# Patient Record
Sex: Female | Born: 1950
Health system: Southern US, Community
[De-identification: ages and names within clinical notes are randomized; demographics above are authoritative.]

## PROBLEM LIST (undated history)

## (undated) DIAGNOSIS — L309 Dermatitis, unspecified: Secondary | ICD-10-CM

## (undated) DIAGNOSIS — R51 Headache: Secondary | ICD-10-CM

## (undated) DIAGNOSIS — Z780 Asymptomatic menopausal state: Secondary | ICD-10-CM

## (undated) DIAGNOSIS — B977 Papillomavirus as the cause of diseases classified elsewhere: Secondary | ICD-10-CM

## (undated) DIAGNOSIS — D219 Benign neoplasm of connective and other soft tissue, unspecified: Secondary | ICD-10-CM

## (undated) DIAGNOSIS — R0602 Shortness of breath: Secondary | ICD-10-CM

## (undated) DIAGNOSIS — B379 Candidiasis, unspecified: Secondary | ICD-10-CM

## (undated) DIAGNOSIS — F329 Major depressive disorder, single episode, unspecified: Secondary | ICD-10-CM

## (undated) DIAGNOSIS — L719 Rosacea, unspecified: Secondary | ICD-10-CM

## (undated) DIAGNOSIS — Z823 Family history of stroke: Secondary | ICD-10-CM

## (undated) DIAGNOSIS — T8859XA Other complications of anesthesia, initial encounter: Secondary | ICD-10-CM

## (undated) DIAGNOSIS — N159 Renal tubulo-interstitial disease, unspecified: Secondary | ICD-10-CM

## (undated) DIAGNOSIS — Z8619 Personal history of other infectious and parasitic diseases: Secondary | ICD-10-CM

## (undated) DIAGNOSIS — A499 Bacterial infection, unspecified: Secondary | ICD-10-CM

## (undated) DIAGNOSIS — T4145XA Adverse effect of unspecified anesthetic, initial encounter: Secondary | ICD-10-CM

## (undated) DIAGNOSIS — Z809 Family history of malignant neoplasm, unspecified: Secondary | ICD-10-CM

## (undated) DIAGNOSIS — D126 Benign neoplasm of colon, unspecified: Secondary | ICD-10-CM

## (undated) DIAGNOSIS — Z87898 Personal history of other specified conditions: Secondary | ICD-10-CM

## (undated) DIAGNOSIS — N6009 Solitary cyst of unspecified breast: Secondary | ICD-10-CM

## (undated) DIAGNOSIS — Z8249 Family history of ischemic heart disease and other diseases of the circulatory system: Secondary | ICD-10-CM

## (undated) DIAGNOSIS — F419 Anxiety disorder, unspecified: Secondary | ICD-10-CM

## (undated) DIAGNOSIS — Z8719 Personal history of other diseases of the digestive system: Secondary | ICD-10-CM

## (undated) DIAGNOSIS — I6529 Occlusion and stenosis of unspecified carotid artery: Secondary | ICD-10-CM

## (undated) DIAGNOSIS — K219 Gastro-esophageal reflux disease without esophagitis: Secondary | ICD-10-CM

## (undated) DIAGNOSIS — IMO0002 Reserved for concepts with insufficient information to code with codable children: Secondary | ICD-10-CM

## (undated) DIAGNOSIS — Z8669 Personal history of other diseases of the nervous system and sense organs: Secondary | ICD-10-CM

## (undated) DIAGNOSIS — R41840 Attention and concentration deficit: Secondary | ICD-10-CM

## (undated) HISTORY — DX: Asymptomatic menopausal state: Z78.0

## (undated) HISTORY — DX: Family history of ischemic heart disease and other diseases of the circulatory system: Z82.49

## (undated) HISTORY — DX: Personal history of other diseases of the nervous system and sense organs: Z86.69

## (undated) HISTORY — DX: Family history of stroke: Z82.3

## (undated) HISTORY — DX: Rosacea, unspecified: L71.9

## (undated) HISTORY — DX: Personal history of other diseases of the digestive system: Z87.19

## (undated) HISTORY — DX: Benign neoplasm of connective and other soft tissue, unspecified: D21.9

## (undated) HISTORY — DX: Candidiasis, unspecified: B37.9

## (undated) HISTORY — DX: Personal history of other infectious and parasitic diseases: Z86.19

## (undated) HISTORY — DX: Attention and concentration deficit: R41.840

## (undated) HISTORY — DX: Benign neoplasm of colon, unspecified: D12.6

## (undated) HISTORY — DX: Occlusion and stenosis of unspecified carotid artery: I65.29

## (undated) HISTORY — DX: Papillomavirus as the cause of diseases classified elsewhere: B97.7

## (undated) HISTORY — DX: Renal tubulo-interstitial disease, unspecified: N15.9

## (undated) HISTORY — DX: Bacterial infection, unspecified: A49.9

## (undated) HISTORY — DX: Major depressive disorder, single episode, unspecified: F32.9

## (undated) HISTORY — DX: Gastro-esophageal reflux disease without esophagitis: K21.9

## (undated) HISTORY — DX: Reserved for concepts with insufficient information to code with codable children: IMO0002

## (undated) HISTORY — DX: Family history of malignant neoplasm, unspecified: Z80.9

## (undated) HISTORY — DX: Anxiety disorder, unspecified: F41.9

## (undated) HISTORY — DX: Dermatitis, unspecified: L30.9

## (undated) HISTORY — DX: Solitary cyst of unspecified breast: N60.09

## (undated) HISTORY — PX: EYE SURGERY: SHX253

## (undated) HISTORY — DX: Personal history of other specified conditions: Z87.898

---

## 1984-04-04 DIAGNOSIS — IMO0002 Reserved for concepts with insufficient information to code with codable children: Secondary | ICD-10-CM

## 1984-04-04 DIAGNOSIS — R87619 Unspecified abnormal cytological findings in specimens from cervix uteri: Secondary | ICD-10-CM

## 1984-04-04 HISTORY — PX: MYOMECTOMY: SHX85

## 1984-04-04 HISTORY — DX: Reserved for concepts with insufficient information to code with codable children: IMO0002

## 1984-04-04 HISTORY — DX: Unspecified abnormal cytological findings in specimens from cervix uteri: R87.619

## 1994-04-04 HISTORY — PX: HYSTEROSCOPY: SHX211

## 1999-12-10 ENCOUNTER — Encounter: Admission: RE | Admit: 1999-12-10 | Discharge: 1999-12-10 | Payer: Self-pay | Admitting: *Deleted

## 1999-12-10 ENCOUNTER — Encounter: Payer: Self-pay | Admitting: *Deleted

## 2000-01-17 ENCOUNTER — Other Ambulatory Visit: Admission: RE | Admit: 2000-01-17 | Discharge: 2000-01-17 | Payer: Self-pay | Admitting: Obstetrics and Gynecology

## 2000-10-13 ENCOUNTER — Encounter: Payer: Self-pay | Admitting: *Deleted

## 2000-10-13 ENCOUNTER — Encounter: Admission: RE | Admit: 2000-10-13 | Discharge: 2000-10-13 | Payer: Self-pay | Admitting: *Deleted

## 2001-01-29 ENCOUNTER — Other Ambulatory Visit: Admission: RE | Admit: 2001-01-29 | Discharge: 2001-01-29 | Payer: Self-pay | Admitting: Obstetrics and Gynecology

## 2001-03-09 ENCOUNTER — Ambulatory Visit (HOSPITAL_COMMUNITY): Admission: RE | Admit: 2001-03-09 | Discharge: 2001-03-09 | Payer: Self-pay | Admitting: Gastroenterology

## 2002-03-12 ENCOUNTER — Other Ambulatory Visit: Admission: RE | Admit: 2002-03-12 | Discharge: 2002-03-12 | Payer: Self-pay | Admitting: Obstetrics and Gynecology

## 2003-03-20 ENCOUNTER — Other Ambulatory Visit: Admission: RE | Admit: 2003-03-20 | Discharge: 2003-03-20 | Payer: Self-pay | Admitting: Obstetrics and Gynecology

## 2003-04-16 ENCOUNTER — Ambulatory Visit (HOSPITAL_COMMUNITY): Admission: RE | Admit: 2003-04-16 | Discharge: 2003-04-16 | Payer: Self-pay | Admitting: Internal Medicine

## 2003-05-06 DIAGNOSIS — D219 Benign neoplasm of connective and other soft tissue, unspecified: Secondary | ICD-10-CM

## 2003-05-06 HISTORY — DX: Benign neoplasm of connective and other soft tissue, unspecified: D21.9

## 2003-05-27 ENCOUNTER — Ambulatory Visit (HOSPITAL_BASED_OUTPATIENT_CLINIC_OR_DEPARTMENT_OTHER): Admission: RE | Admit: 2003-05-27 | Discharge: 2003-05-27 | Payer: Self-pay | Admitting: Obstetrics and Gynecology

## 2003-05-27 ENCOUNTER — Ambulatory Visit (HOSPITAL_COMMUNITY): Admission: RE | Admit: 2003-05-27 | Discharge: 2003-05-27 | Payer: Self-pay | Admitting: Obstetrics and Gynecology

## 2003-05-27 ENCOUNTER — Encounter (INDEPENDENT_AMBULATORY_CARE_PROVIDER_SITE_OTHER): Payer: Self-pay | Admitting: *Deleted

## 2003-12-02 ENCOUNTER — Encounter: Admission: RE | Admit: 2003-12-02 | Discharge: 2003-12-02 | Payer: Self-pay | Admitting: Obstetrics and Gynecology

## 2004-03-23 ENCOUNTER — Other Ambulatory Visit: Admission: RE | Admit: 2004-03-23 | Discharge: 2004-03-23 | Payer: Self-pay | Admitting: Obstetrics and Gynecology

## 2005-06-07 ENCOUNTER — Encounter: Admission: RE | Admit: 2005-06-07 | Discharge: 2005-06-07 | Payer: Self-pay | Admitting: Obstetrics and Gynecology

## 2005-12-16 ENCOUNTER — Other Ambulatory Visit: Admission: RE | Admit: 2005-12-16 | Discharge: 2005-12-16 | Payer: Self-pay | Admitting: Obstetrics & Gynecology

## 2006-07-05 ENCOUNTER — Encounter: Admission: RE | Admit: 2006-07-05 | Discharge: 2006-07-05 | Payer: Self-pay | Admitting: Obstetrics and Gynecology

## 2006-07-11 ENCOUNTER — Encounter: Admission: RE | Admit: 2006-07-11 | Discharge: 2006-07-11 | Payer: Self-pay | Admitting: Allergy and Immunology

## 2007-07-17 ENCOUNTER — Other Ambulatory Visit: Admission: RE | Admit: 2007-07-17 | Discharge: 2007-07-17 | Payer: Self-pay | Admitting: Obstetrics and Gynecology

## 2007-12-06 ENCOUNTER — Encounter: Admission: RE | Admit: 2007-12-06 | Discharge: 2007-12-06 | Payer: Self-pay | Admitting: Obstetrics and Gynecology

## 2008-07-18 ENCOUNTER — Other Ambulatory Visit: Admission: RE | Admit: 2008-07-18 | Discharge: 2008-07-18 | Payer: Self-pay | Admitting: Obstetrics and Gynecology

## 2009-04-15 ENCOUNTER — Other Ambulatory Visit: Admission: RE | Admit: 2009-04-15 | Discharge: 2009-04-15 | Payer: Self-pay | Admitting: Obstetrics and Gynecology

## 2009-04-20 ENCOUNTER — Encounter: Admission: RE | Admit: 2009-04-20 | Discharge: 2009-04-20 | Payer: Self-pay | Admitting: Obstetrics and Gynecology

## 2009-05-08 ENCOUNTER — Encounter: Admission: RE | Admit: 2009-05-08 | Discharge: 2009-05-08 | Payer: Self-pay | Admitting: Family Medicine

## 2009-05-08 DIAGNOSIS — I6529 Occlusion and stenosis of unspecified carotid artery: Secondary | ICD-10-CM | POA: Insufficient documentation

## 2009-05-08 HISTORY — DX: Occlusion and stenosis of unspecified carotid artery: I65.29

## 2010-04-22 ENCOUNTER — Other Ambulatory Visit
Admission: RE | Admit: 2010-04-22 | Discharge: 2010-04-22 | Payer: Self-pay | Source: Home / Self Care | Admitting: Obstetrics and Gynecology

## 2010-04-22 ENCOUNTER — Other Ambulatory Visit: Payer: Self-pay | Admitting: Nurse Practitioner

## 2010-04-25 ENCOUNTER — Encounter: Payer: Self-pay | Admitting: Family Medicine

## 2010-08-20 NOTE — Op Note (Signed)
NAME:  [name], [name]                              ACCOUNT NO.:  [id number]   MEDICAL RECORD NO.:  [id number]                   PATIENT TYPE:  AMB   LOCATION:  [facility]                                 FACILITY:  [facility]   PHYSICIAN:  Laqueta Linden, M.D.                 DATE OF BIRTH:  13-Nov-1950   DATE OF PROCEDURE:  05/27/2003  DATE OF DISCHARGE:                                 OPERATIVE REPORT   PREOPERATIVE DIAGNOSES:  Prolapsing fibroid versus polyp with menorrhagia  and breakthrough bleeding.   POSTOPERATIVE DIAGNOSES:  Prolapsing fibroid versus polyp with menorrhagia  and breakthrough bleeding.   PROCEDURE:  Hysteroscopic evaluation with resection of the polyp and  curettage.   SURGEON:  Laqueta Linden, M.D.   ANESTHESIA:  General LMA.   ESTIMATED BLOOD LOSS:  Less than 20 mL.   SORBITOL NET INTAKE:  25 mL.   COMPLICATIONS:  None.   INDICATIONS FOR PROCEDURE:  [name] [name] is a 60 year old, para 0 female on  progesterone only pills who presents with heavier menses and with clots and  mid cycle breakthrough bleeding on her pills.  She was noted on exam to have  an approximately 1 cm polyp versus fibroid prolapsing through the cervical  os which appeared to be dilating the os. The patient was intolerant of  traction or attempted removal in the office. She is therefore to undergo  hysteroscopic evaluation and removal of this lesion.  She has seen the  operative hysteroscopic film and has voiced her understanding and acceptance  of all risks, benefits, alternatives, complications and limitations of the  procedure including possible incomplete removal of the lesion requiring  additional management.  She agrees to the proceed.   DESCRIPTION OF PROCEDURE:  She was taken to the operating room and after  proper identification and consents were ascertained, she was placed on the  operating table in supine position. After general LMA was placed, she was  placed in the [place] stirrups and  the perineum and vagina were prepped and  draped in routine sterile fashion. A transurethral Foley was placed and was  removed at the conclusion of the procedure. Bimanual examination confirmed  the uterus to be anterior, slightly enlarged and freely mobile.  A speculum  was placed in the vagina and this rounded fibroid looking lesion was noted  at the internal os.  The os was patent at approximately 11 o'clock around  the side of this lesion and was easily dilated to a #21 Pratt dilator. The  visualizing hysteroscope was then inserted around this lesion which appeared  to be emanating from the cervix perhaps just inside the lower uterine  segment on the left side of the uterus. The scope was inserted into the  fundus and there were no other lesions identified.  The endometrial cavity  actually appeared quite smooth and atrophic in appearance and there were  no  other polyps, fibroids or lesions identified. The hysteroscope was then  removed. This polyp was grasped with polyp forceps and various tenaculums  and was eased through the os. The base was cut several times and then it was  regrasped.  The scope was reinserted on several occasions and there was no  active bleeding noted.  There was no obvious base of the polyp noted and it  was felt that the entire lesion had been removed in this fashion. The scope  was then reinserted into the uterine cavity with full visualization with no  obvious lesions noted. Sharp curettage was then performed productive of a  minimal amount of additional tissue.  Of note, there was a palpable  irregularity on the left lower uterine segment consistent with a possible  submucosal fibroid or transmural fibroid that was not visualized at the time  of hysteroscopic evaluation. All instruments were removed. There was no  active bleeding from the tenaculum site or from the cervix. The catheter was  removed. This patient was stable on transfer to the recovery room.  She  received Toradol 30 mg IV, 30 mg IM prior to awakening. She will be observed  and discharged per anesthesia protocol. She is to followup in the office in  two weeks or sooner for excessive pain, fever or bleeding or other concerns.  Per the patient's request, she was given a prescription for Phenergan 25 mg,  dispensed 10, 1/2 to 1 q. 4-6h. p.r.n. nausea and Darvocet-N 100 dispensed  10, 1-2 q. 4-6h. p.r.n. pain.                                               Laqueta Linden, M.D.    Danie Chandler  D:  05/27/2003  T:  05/27/2003  Job:  [id number]

## 2010-12-22 ENCOUNTER — Other Ambulatory Visit: Payer: Self-pay | Admitting: Family Medicine

## 2010-12-22 DIAGNOSIS — Z1231 Encounter for screening mammogram for malignant neoplasm of breast: Secondary | ICD-10-CM

## 2011-01-05 ENCOUNTER — Ambulatory Visit
Admission: RE | Admit: 2011-01-05 | Discharge: 2011-01-05 | Disposition: A | Discharge status: Home / Self Care | Payer: 59 | Source: Ambulatory Visit | Attending: Family Medicine | Admitting: Family Medicine

## 2011-01-05 DIAGNOSIS — Z1231 Encounter for screening mammogram for malignant neoplasm of breast: Secondary | ICD-10-CM

## 2011-01-10 ENCOUNTER — Other Ambulatory Visit: Payer: Self-pay | Admitting: Family Medicine

## 2011-01-10 DIAGNOSIS — R928 Other abnormal and inconclusive findings on diagnostic imaging of breast: Secondary | ICD-10-CM

## 2011-01-21 ENCOUNTER — Ambulatory Visit
Admission: RE | Admit: 2011-01-21 | Discharge: 2011-01-21 | Disposition: A | Discharge status: Home / Self Care | Payer: 59 | Source: Ambulatory Visit | Attending: Family Medicine | Admitting: Family Medicine

## 2011-01-21 DIAGNOSIS — R928 Other abnormal and inconclusive findings on diagnostic imaging of breast: Secondary | ICD-10-CM

## 2011-04-25 ENCOUNTER — Encounter: Payer: Self-pay | Admitting: Family Medicine

## 2011-04-25 ENCOUNTER — Ambulatory Visit (INDEPENDENT_AMBULATORY_CARE_PROVIDER_SITE_OTHER): Payer: 59 | Admitting: Family Medicine

## 2011-04-25 DIAGNOSIS — F329 Major depressive disorder, single episode, unspecified: Secondary | ICD-10-CM | POA: Insufficient documentation

## 2011-04-25 DIAGNOSIS — F419 Anxiety disorder, unspecified: Secondary | ICD-10-CM

## 2011-04-25 DIAGNOSIS — K219 Gastro-esophageal reflux disease without esophagitis: Secondary | ICD-10-CM

## 2011-04-25 DIAGNOSIS — F32A Depression, unspecified: Secondary | ICD-10-CM

## 2011-04-25 DIAGNOSIS — L309 Dermatitis, unspecified: Secondary | ICD-10-CM

## 2011-04-25 DIAGNOSIS — L719 Rosacea, unspecified: Secondary | ICD-10-CM

## 2011-04-25 DIAGNOSIS — N950 Postmenopausal bleeding: Secondary | ICD-10-CM

## 2011-04-25 DIAGNOSIS — L259 Unspecified contact dermatitis, unspecified cause: Secondary | ICD-10-CM

## 2011-04-25 DIAGNOSIS — Z780 Asymptomatic menopausal state: Secondary | ICD-10-CM

## 2011-05-30 ENCOUNTER — Ambulatory Visit (INDEPENDENT_AMBULATORY_CARE_PROVIDER_SITE_OTHER): Payer: 59 | Admitting: Family Medicine

## 2011-05-30 ENCOUNTER — Encounter: Payer: Self-pay | Admitting: Family Medicine

## 2011-05-30 VITALS — BP 151/74 | HR 100 | Temp 97.1°F | Resp 16 | Ht 64.0 in | Wt 144.8 lb

## 2011-05-30 DIAGNOSIS — N950 Postmenopausal bleeding: Secondary | ICD-10-CM

## 2011-05-30 NOTE — Progress Notes (Signed)
  Subjective:    [name], female    DOB: 08-Apr-1950, 61 y.o.   MRN: [id number]  HPI  Patient presents complaining of vaginal bleeding. History of fibroids. Dr. Myrlene Broker performed fibroidectomy 2005. Also has seen Dr. Hessie Diener and Dr Thomasena Edis. 2011 Dr. Thomasena Edis performed transvaginal ultrasound  which did not show any fibroids.  Review of Systems     Objective:   Physical Exam  Constitutional: She appears well-developed and well-nourished.  Neck: Neck supple. No thyromegaly present.  Cardiovascular: Normal rate, regular rhythm and normal heart sounds.   Pulmonary/Chest: Effort normal and breath sounds normal.  Abdominal: Soft. Bowel sounds are normal. She exhibits no mass.  Genitourinary: Vagina normal. Right adnexum displays no mass. Left adnexum displays no mass.       Scant blood from os  Neurological: She is alert.  Skin: Skin is warm.  Psychiatric: She has a normal mood and affect.        Assessment & Plan:   1. Postmenopausal vaginal bleeding  Pap IG (Image Guided), Ambulatory referral to Obstetrics / Gynecology   Anticipatory guidance

## 2011-05-31 LAB — HM PAP SMEAR: HM Pap smear: NORMAL

## 2011-05-31 LAB — PAP IG (IMAGE GUIDED)

## 2011-07-06 ENCOUNTER — Encounter (INDEPENDENT_AMBULATORY_CARE_PROVIDER_SITE_OTHER): Payer: 59 | Admitting: Obstetrics and Gynecology

## 2011-07-06 DIAGNOSIS — N924 Excessive bleeding in the premenopausal period: Secondary | ICD-10-CM

## 2011-07-06 DIAGNOSIS — IMO0002 Reserved for concepts with insufficient information to code with codable children: Secondary | ICD-10-CM

## 2011-07-06 DIAGNOSIS — F32A Depression, unspecified: Secondary | ICD-10-CM

## 2011-07-06 DIAGNOSIS — F419 Anxiety disorder, unspecified: Secondary | ICD-10-CM

## 2011-07-06 DIAGNOSIS — D259 Leiomyoma of uterus, unspecified: Secondary | ICD-10-CM

## 2011-07-06 HISTORY — DX: Reserved for concepts with insufficient information to code with codable children: IMO0002

## 2011-07-06 HISTORY — DX: Anxiety disorder, unspecified: F41.9

## 2011-07-06 HISTORY — DX: Depression, unspecified: F32.A

## 2011-07-18 ENCOUNTER — Other Ambulatory Visit: Payer: Self-pay | Admitting: Obstetrics and Gynecology

## 2011-08-01 ENCOUNTER — Other Ambulatory Visit: Payer: Self-pay | Admitting: Obstetrics and Gynecology

## 2011-08-01 ENCOUNTER — Encounter (HOSPITAL_COMMUNITY): Payer: Self-pay | Admitting: Pharmacist

## 2011-08-02 NOTE — Progress Notes (Signed)
Addended by: Hal Morales on: 08/02/2011 10:48 AM   Modules accepted: Orders

## 2011-08-15 ENCOUNTER — Inpatient Hospital Stay (HOSPITAL_COMMUNITY): Admission: RE | Admit: 2011-08-15 | Payer: 59 | Source: Ambulatory Visit

## 2011-08-17 ENCOUNTER — Encounter (HOSPITAL_COMMUNITY)
Admission: RE | Admit: 2011-08-17 | Discharge: 2011-08-17 | Disposition: A | Discharge status: Home / Self Care | Payer: 59 | Source: Ambulatory Visit | Attending: Obstetrics and Gynecology | Admitting: Obstetrics and Gynecology

## 2011-08-17 ENCOUNTER — Encounter (HOSPITAL_COMMUNITY): Payer: Self-pay

## 2011-08-17 DIAGNOSIS — Z01812 Encounter for preprocedural laboratory examination: Secondary | ICD-10-CM | POA: Insufficient documentation

## 2011-08-17 DIAGNOSIS — Z01818 Encounter for other preprocedural examination: Secondary | ICD-10-CM | POA: Insufficient documentation

## 2011-08-17 HISTORY — DX: Headache: R51

## 2011-08-17 HISTORY — DX: Other complications of anesthesia, initial encounter: T88.59XA

## 2011-08-17 HISTORY — DX: Adverse effect of unspecified anesthetic, initial encounter: T41.45XA

## 2011-08-17 HISTORY — DX: Shortness of breath: R06.02

## 2011-08-17 LAB — CBC
HCT: 39.8 % (ref 36.0–46.0)
Hemoglobin: 13.3 g/dL (ref 12.0–15.0)
MCHC: 33.4 g/dL (ref 30.0–36.0)
MCV: 89.8 fL (ref 78.0–100.0)

## 2011-08-17 NOTE — Patient Instructions (Signed)
YOUR PROCEDURE IS SCHEDULED ON:09/02/11  ENTER THROUGH THE MAIN ENTRANCE OF [facility] AT:0730 am  USE DESK PHONE AND DIAL [id number] TO INFORM us OF YOUR ARRIVAL  CALL [phone] IF YOU HAVE ANY QUESTIONS OR PROBLEMS PRIOR TO YOUR ARRIVAL.  REMEMBER: DO NOT EAT OR DRINK AFTER MIDNIGHT : Thursday   YOU MAY BRUSH YOUR TEETH THE MORNING OF SURGERY   TAKE THESE MEDICINES THE DAY OF SURGERY WITH SIP OF WATER:am meds   DO NOT WEAR JEWELRY, EYE MAKEUP, LIPSTICK OR DARK FINGERNAIL POLISH DO NOT WEAR LOTIONS OR DEODORANT DO NOT SHAVE FOR 48 HOURS PRIOR TO SURGERY  YOU WILL NOT BE ALLOWED TO DRIVE YOURSELF HOME.  NAME OF DRIVER:Richard Zweigenhaft

## 2011-08-22 ENCOUNTER — Telehealth: Payer: Self-pay | Admitting: Obstetrics and Gynecology

## 2011-08-22 NOTE — Telephone Encounter (Signed)
Hysteroscopic resection of Fibroid with Versapoint, D&C ;Removal of Perineal Lesion scheduled for 09/02/11 @ 9:00 with [facility]. [employer] effective 04/04/08.  Plan pays 70/30 after a $1,500 deductible. Adrianne Pridgen

## 2011-08-31 ENCOUNTER — Telehealth: Payer: Self-pay | Admitting: Obstetrics and Gynecology

## 2011-08-31 NOTE — Telephone Encounter (Signed)
Lm on vm to cb per telephone call.  

## 2011-08-31 NOTE — Telephone Encounter (Signed)
chandra/epic 

## 2011-09-01 ENCOUNTER — Ambulatory Visit (INDEPENDENT_AMBULATORY_CARE_PROVIDER_SITE_OTHER): Payer: 59 | Admitting: Obstetrics and Gynecology

## 2011-09-01 ENCOUNTER — Encounter: Payer: Self-pay | Admitting: Obstetrics and Gynecology

## 2011-09-01 VITALS — BP 142/72 | Temp 98.5°F | Ht 64.0 in | Wt 144.0 lb

## 2011-09-01 DIAGNOSIS — A630 Anogenital (venereal) warts: Secondary | ICD-10-CM | POA: Insufficient documentation

## 2011-09-01 DIAGNOSIS — N920 Excessive and frequent menstruation with regular cycle: Secondary | ICD-10-CM

## 2011-09-01 DIAGNOSIS — R300 Dysuria: Secondary | ICD-10-CM

## 2011-09-01 DIAGNOSIS — N924 Excessive bleeding in the premenopausal period: Secondary | ICD-10-CM

## 2011-09-01 DIAGNOSIS — Z780 Asymptomatic menopausal state: Secondary | ICD-10-CM

## 2011-09-01 LAB — POCT URINALYSIS DIPSTICK
Blood, UA: 1
Glucose, UA: NEGATIVE
Ketones, UA: NEGATIVE
Spec Grav, UA: 1.025
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
## 2011-09-01 NOTE — Patient Instructions (Signed)
Pt has cytotec to be used 1 tablet in vagina 12 hrs and 6 hrs  Prior to procedure

## 2011-09-01 NOTE — Telephone Encounter (Signed)
Pt seen today in office for pre-op appt with [facility].

## 2011-09-02 ENCOUNTER — Ambulatory Visit (HOSPITAL_COMMUNITY)
Admission: RE | Admit: 2011-09-02 | Discharge: 2011-09-02 | Disposition: A | Discharge status: Home / Self Care | Payer: 59 | Source: Ambulatory Visit | Attending: Obstetrics and Gynecology | Admitting: Obstetrics and Gynecology

## 2011-09-02 ENCOUNTER — Other Ambulatory Visit: Payer: Self-pay

## 2011-09-02 ENCOUNTER — Encounter (HOSPITAL_COMMUNITY): Payer: Self-pay | Admitting: Anesthesiology

## 2011-09-02 ENCOUNTER — Encounter (HOSPITAL_COMMUNITY)
Admission: RE | Disposition: A | Discharge status: Home / Self Care | Payer: Self-pay | Source: Ambulatory Visit | Attending: Obstetrics and Gynecology

## 2011-09-02 ENCOUNTER — Encounter (HOSPITAL_COMMUNITY): Payer: Self-pay | Admitting: *Deleted

## 2011-09-02 ENCOUNTER — Ambulatory Visit (HOSPITAL_COMMUNITY): Payer: 59 | Admitting: Anesthesiology

## 2011-09-02 DIAGNOSIS — N950 Postmenopausal bleeding: Secondary | ICD-10-CM

## 2011-09-02 DIAGNOSIS — Z01812 Encounter for preprocedural laboratory examination: Secondary | ICD-10-CM | POA: Insufficient documentation

## 2011-09-02 DIAGNOSIS — Z01818 Encounter for other preprocedural examination: Secondary | ICD-10-CM | POA: Insufficient documentation

## 2011-09-02 DIAGNOSIS — N736 Female pelvic peritoneal adhesions (postinfective): Secondary | ICD-10-CM | POA: Diagnosis present

## 2011-09-02 DIAGNOSIS — A630 Anogenital (venereal) warts: Secondary | ICD-10-CM

## 2011-09-02 DIAGNOSIS — N856 Intrauterine synechiae: Secondary | ICD-10-CM

## 2011-09-02 HISTORY — PX: HYSTEROSCOPY WITH D & C: SHX1775

## 2011-09-02 SURGERY — DILATATION AND CURETTAGE /HYSTEROSCOPY
Anesthesia: General | Site: Uterus | Wound class: Clean Contaminated

## 2011-09-02 MED ORDER — PROPOFOL 10 MG/ML IV EMUL
INTRAVENOUS | Status: AC
  Filled 2011-09-02: qty 20

## 2011-09-02 MED ORDER — GLYCINE 1.5 % IR SOLN
Status: DC | PRN
  Administered 2011-09-02: 1

## 2011-09-02 MED ORDER — ONDANSETRON HCL 4 MG/2ML IJ SOLN
INTRAMUSCULAR | Status: AC
  Filled 2011-09-02: qty 2

## 2011-09-02 MED ORDER — METOCLOPRAMIDE HCL 5 MG/ML IJ SOLN
INTRAMUSCULAR | Status: AC
  Filled 2011-09-02: qty 2

## 2011-09-02 MED ORDER — FENTANYL CITRATE 0.05 MG/ML IJ SOLN
INTRAMUSCULAR | Status: AC
  Filled 2011-09-02: qty 5

## 2011-09-02 MED ORDER — FENTANYL CITRATE 0.05 MG/ML IJ SOLN: 25.0000 ug | INTRAMUSCULAR | Status: DC | PRN

## 2011-09-02 MED ORDER — MIDAZOLAM HCL 5 MG/5ML IJ SOLN
INTRAMUSCULAR | Status: DC | PRN
  Administered 2011-09-02: 2 mg via INTRAVENOUS

## 2011-09-02 MED ORDER — SODIUM CHLORIDE 0.9 % IR SOLN
Status: DC | PRN
  Administered 2011-09-02: 1

## 2011-09-02 MED ORDER — LIDOCAINE HCL (CARDIAC) 20 MG/ML IV SOLN
INTRAVENOUS | Status: DC | PRN
  Administered 2011-09-02: 50 mg via INTRAVENOUS

## 2011-09-02 MED ORDER — DEXAMETHASONE SODIUM PHOSPHATE 10 MG/ML IJ SOLN
INTRAMUSCULAR | Status: DC | PRN
  Administered 2011-09-02: 10 mg via INTRAVENOUS

## 2011-09-02 MED ORDER — PROPOFOL 10 MG/ML IV EMUL
INTRAVENOUS | Status: DC | PRN
  Administered 2011-09-02: 160 mg via INTRAVENOUS

## 2011-09-02 MED ORDER — KETOROLAC TROMETHAMINE 15 MG/ML IJ SOLN
INTRAMUSCULAR | Status: DC | PRN
  Administered 2011-09-02: 15 mg via INTRAMUSCULAR
  Administered 2011-09-02: 15 mg via INTRAVENOUS

## 2011-09-02 MED ORDER — SILVER NITRATE-POT NITRATE 75-25 % EX MISC
CUTANEOUS | Status: DC | PRN
  Administered 2011-09-02: 1

## 2011-09-02 MED ORDER — METOCLOPRAMIDE HCL 5 MG/ML IJ SOLN
INTRAMUSCULAR | Status: DC | PRN
  Administered 2011-09-02: 10 mg via INTRAVENOUS

## 2011-09-02 MED ORDER — FENTANYL CITRATE 0.05 MG/ML IJ SOLN
INTRAMUSCULAR | Status: DC | PRN
  Administered 2011-09-02: 100 ug via INTRAVENOUS

## 2011-09-02 MED ORDER — KETOROLAC TROMETHAMINE 30 MG/ML IJ SOLN
INTRAMUSCULAR | Status: AC
  Filled 2011-09-02: qty 1

## 2011-09-02 MED ORDER — DEXAMETHASONE SODIUM PHOSPHATE 10 MG/ML IJ SOLN
INTRAMUSCULAR | Status: AC
  Filled 2011-09-02: qty 1

## 2011-09-02 MED ORDER — LIDOCAINE HCL (CARDIAC) 20 MG/ML IV SOLN
INTRAVENOUS | Status: AC
  Filled 2011-09-02: qty 5

## 2011-09-02 MED ORDER — ONDANSETRON HCL 4 MG/2ML IJ SOLN
INTRAMUSCULAR | Status: DC | PRN
  Administered 2011-09-02: 4 mg via INTRAVENOUS

## 2011-09-02 MED ORDER — IBUPROFEN 600 MG PO TABS: ORAL_TABLET | ORAL | Status: AC

## 2011-09-02 MED ORDER — SILVER NITRATE-POT NITRATE 75-25 % EX MISC
CUTANEOUS | Status: AC
  Filled 2011-09-02: qty 2

## 2011-09-02 MED ORDER — PHENYLEPHRINE HCL 10 MG/ML IJ SOLN
INTRAMUSCULAR | Status: DC | PRN
  Administered 2011-09-02 (×2): 40 ug via INTRAVENOUS

## 2011-09-02 MED ORDER — LACTATED RINGERS IV SOLN
INTRAVENOUS | Status: DC
  Administered 2011-09-02 (×2): via INTRAVENOUS

## 2011-09-02 MED ORDER — MIDAZOLAM HCL 2 MG/2ML IJ SOLN
INTRAMUSCULAR | Status: AC
  Filled 2011-09-02: qty 2

## 2011-09-02 MED ORDER — LIDOCAINE HCL 2 % IJ SOLN
INTRAMUSCULAR | Status: AC
  Filled 2011-09-02: qty 1

## 2011-09-02 MED ORDER — PHENYLEPHRINE 40 MCG/ML (10ML) SYRINGE FOR IV PUSH (FOR BLOOD PRESSURE SUPPORT)
PREFILLED_SYRINGE | INTRAVENOUS | Status: AC
  Filled 2011-09-02: qty 5

## 2011-09-02 MED ORDER — FERRIC SUBSULFATE SOLN
Status: DC | PRN
  Administered 2011-09-02: 1

## 2011-09-02 MED ORDER — CEFAZOLIN SODIUM-DEXTROSE 2-3 GM-% IV SOLR
2.0000 g | Freq: Once | INTRAVENOUS | Status: AC
  Administered 2011-09-02: 2 g via INTRAVENOUS
  Filled 2011-09-02: qty 50

## 2011-09-02 MED ORDER — LIDOCAINE HCL 2 % IJ SOLN
INTRAMUSCULAR | Status: DC | PRN
  Administered 2011-09-02: 15 mL

## 2011-09-02 SURGICAL SUPPLY — 23 items
BOOTIES KNEE HIGH SLOAN (MISCELLANEOUS) ×6 IMPLANT
CANISTER SUCTION 2500CC (MISCELLANEOUS) ×3 IMPLANT
CATH ROBINSON RED A/P 16FR (CATHETERS) ×3 IMPLANT
CLOTH BEACON ORANGE TIMEOUT ST (SAFETY) ×3 IMPLANT
CONTAINER PREFILL 10% NBF 60ML (FORM) ×6 IMPLANT
CORD ACTIVE DISPOSABLE (ELECTRODE) ×1
CORD ELECTRO ACTIVE DISP (ELECTRODE) ×2 IMPLANT
DILATOR CANAL MILEX (MISCELLANEOUS) IMPLANT
ELECT LOOP GYNE PRO 24FR (CUTTING LOOP)
ELECT REM PT RETURN 9FT ADLT (ELECTROSURGICAL) ×3
ELECT VAPORTRODE GRVD BAR (ELECTRODE) IMPLANT
ELECTRODE LOOP GYNE PRO 24FR (CUTTING LOOP) IMPLANT
ELECTRODE REM PT RTRN 9FT ADLT (ELECTROSURGICAL) ×2 IMPLANT
ELECTRODE ROLLER VERSAPOINT (ELECTRODE) IMPLANT
ELECTRODE RT ANGLE VERSAPOINT (CUTTING LOOP) ×2 IMPLANT
ELECTRODE TWIZZLE TIP (MISCELLANEOUS) IMPLANT
GLOVE SURG SS PI 6.5 STRL IVOR (GLOVE) ×6 IMPLANT
GOWN PREVENTION PLUS LG XLONG (DISPOSABLE) ×6 IMPLANT
GOWN STRL REIN XL XLG (GOWN DISPOSABLE) ×3 IMPLANT
LOOP ANGLED CUTTING 22FR (CUTTING LOOP) ×2 IMPLANT
PACK HYSTEROSCOPY LF (CUSTOM PROCEDURE TRAY) ×3 IMPLANT
TOWEL OR 17X24 6PK STRL BLUE (TOWEL DISPOSABLE) ×6 IMPLANT
WATER STERILE IRR 1000ML POUR (IV SOLUTION) ×3 IMPLANT

## 2011-09-02 NOTE — Anesthesia Postprocedure Evaluation (Signed)
  Anesthesia Post-op Note  Patient: [name] [name]  Procedure(s) Performed: Procedure(s) (LRB): DILATATION AND CURETTAGE /HYSTEROSCOPY (N/A)  Patient Location: PACU  Anesthesia Type: General  Level of Consciousness: awake, alert  and oriented  Airway and Oxygen Therapy: Patient Spontanous Breathing  Post-op Pain: mild  Post-op Assessment: Post-op Vital signs reviewed, Patient's Cardiovascular Status Stable, Respiratory Function Stable, Patent Airway, NAUSEA AND VOMITING PRESENT and Pain level controlled  Post-op Vital Signs: Reviewed and stable  Complications: No apparent anesthesia complications

## 2011-09-02 NOTE — Transfer of Care (Signed)
Immediate Anesthesia Transfer of Care Note  Patient: [name] [name]  Procedure(s) Performed: Procedure(s) (LRB): DILATATION AND CURETTAGE /HYSTEROSCOPY (N/A)  Patient Location: PACU  Anesthesia Type: General  Level of Consciousness: sedated  Airway & Oxygen Therapy: Patient Spontanous Breathing and Patient connected to nasal cannula oxygen  Post-op Assessment: Report given to PACU RN  Post vital signs: Reviewed and stable  Complications: No apparent anesthesia complications

## 2011-09-02 NOTE — Discharge Instructions (Signed)
Hysteroscopy Hysteroscopy is a procedure used for looking inside the womb (uterus). It may be done for many different reasons, including:  To evaluate abnormal bleeding, fibroid (benign, noncancerous) tumors, polyps, scar tissue (adhesions), and possibly cancer of the uterus.   To look for lumps (tumors) and other uterine growths.   To look for causes of why a woman cannot get pregnant (infertility), causes of recurrent loss of pregnancy (miscarriages), or a lost intrauterine device (IUD).   To perform a sterilization by blocking the fallopian tubes from inside the uterus.  A hysteroscopy should be done right after a menstrual period to be sure you are not pregnant. LET YOUR CAREGIVER KNOW ABOUT:   Allergies.   Medicines taken, including herbs, eyedrops, over-the-counter medicines, and creams.   Use of steroids (by mouth or creams).   Previous problems with anesthetics or numbing medicines.   History of bleeding or blood problems.   History of blood clots.   Possibility of pregnancy, if this applies.   Previous surgery.   Other health problems.  RISKS AND COMPLICATIONS   Putting a hole in the uterus.   Excessive bleeding.   Infection.   Damage to the cervix.   Injury to other organs.   Allergic reaction to medicines.   Too much fluid used in the uterus for the procedure.  BEFORE THE PROCEDURE   Do not take aspirin or blood thinners for a week before the procedure, or as directed. It can cause bleeding.   Arrive at least 60 minutes before the procedure or as directed to read and sign the necessary forms.   Arrange for someone to take you home after the procedure.   If you smoke, do not smoke for 2 weeks before the procedure.  PROCEDURE   Your caregiver may give you medicine to relax you. He or she may also give you a medicine that numbs the area around the cervix (local anesthetic) or a medicine that makes you sleep (general anesthesia).   Sometimes, a  medicine is placed in the cervix the day before the procedure. This medicine makes the cervix have a larger opening (dilate). This makes it easier for the instrument to be inserted into the uterus.   A small instrument (hysteroscope) is inserted through the vagina into the uterus. This instrument is similar to a pencil-sized telescope with a light.   During the procedure, air or a liquid is put into the uterus, which allows the surgeon to see better.   Sometimes, tissue is gently scraped from inside the uterus. These tissue samples are sent to a specialist who looks at tissue samples (pathologist). The pathologist will give a report to your caregiver. This will help your caregiver decide if further treatment is necessary. The report will also help your caregiver decide on the best treatment if the test comes back abnormal.  AFTER THE PROCEDURE   If you had a general anesthetic, you may be groggy for a couple hours after the procedure.   If you had a local anesthetic, you will be advised to rest at the surgical center or caregiver's office until you are stable and feel ready to go home.   You may have some cramping for a couple days.   You may have bleeding, which varies from light spotting for a few days to menstrual-like bleeding for up to 3 to 7 days. This is normal.   Have someone take you home.  FINDING OUT THE RESULTS OF YOUR TEST Not all test results   are available during your visit. If your test results are not back during the visit, make an appointment with your caregiver to find out the results. Do not assume everything is normal if you have not heard from your caregiver or the medical facility. It is important for you to follow up on all of your test results. HOME CARE INSTRUCTIONS   Do not drive for 24 hours or as instructed.   Only take over-the-counter or prescription medicines for pain, discomfort, or fever as directed by your caregiver.   Do not take aspirin. It can cause or  aggravate bleeding.   Do not drive or drink alcohol while taking pain medicine.   You may resume your usual diet.   Do not use tampons, douche, or have sexual intercourse for 2 weeks, or as advised by your caregiver.   Rest and sleep for the first 24 to 48 hours.   Take your temperature twice a day for 4 to 5 days. Write it down. Give these temperatures to your caregiver if they are abnormal (above 98.6 F or 37.0 C).   Take medicines your caregiver has ordered as directed.   Follow your caregiver's advice regarding diet, exercise, lifting, driving, and general activities.   Take showers instead of baths for 2 weeks, or as recommended by your caregiver.   If you develop constipation:   Take a mild laxative with the advice of your caregiver.   Eat bran foods.   Drink enough water and fluids to keep your urine clear or pale yellow.   Try to have someone with you or available to you for the first 24 to 48 hours, especially if you had a general anesthetic.   Make sure you and your family understand everything about your operation and recovery.   Follow your caregiver's advice regarding follow-up appointments and Pap smears.  SEEK MEDICAL CARE IF:   You feel dizzy or lightheaded.   You feel sick to your stomach (nauseous).   You develop abnormal vaginal discharge.   You develop a rash.   You have an abnormal reaction or allergy to your medicine.   You need stronger pain medicine.  SEEK IMMEDIATE MEDICAL CARE IF:   Bleeding is heavier than a normal menstrual period or you have blood clots.   You have an oral temperature above 102 F (38.9 C), not controlled by medicine.   You have increasing cramps or pains not relieved with medicine.   You develop belly (abdominal) pain that does not seem to be related to the same area of earlier cramping and pain.   You pass out.   You develop pain in the tops of your shoulders (shoulder strap areas).   You develop shortness of  breath.  MAKE SURE YOU:   Understand these instructions.   Will watch your condition.   Will get help right away if you are not doing well or get worse.  Document Released: 06/27/2000 Document Revised: 03/10/2011 Document Reviewed: 10/20/2008 [employer] Patient Information 2012 [employer].DISCHARGE INSTRUCTIONS: HYSTEROSCOPY / ENDOMETRIAL ABLATION The following instructions have been prepared to help you care for yourself upon your return home.  Personal hygiene: . Use sanitary pads for vaginal drainage, not tampons. . Shower the day after your procedure. . NO tub baths, pools or Jacuzzis for 2-3 weeks. . Wipe front to back after using the bathroom.  Activity and limitations: . Do NOT drive or operate any equipment for 24 hours. The effects of anesthesia are still present and drowsiness   may result. . Do NOT rest in bed all day. . Walking is encouraged. . Walk up and down stairs slowly. . You may resume your normal activity in one to two days or as indicated by your physician. Sexual activity: NO intercourse for at least 2 weeks after the procedure, or as indicated by your Doctor.  Diet: Eat a light meal as desired this evening. You may resume your usual diet tomorrow.  Return to Work: You may resume your work activities in one to two days or as indicated by your Doctor.  What to expect after your surgery: Expect to have vaginal bleeding/discharge for 2-3 days and spotting for up to 10 days. It is not unusual to have soreness for up to 1-2 weeks. You may have a slight burning sensation when you urinate for the first day. Mild cramps may continue for a couple of days. You may have a regular period in 2-6 weeks.  Call your doctor for any of the following: . Excessive vaginal bleeding or clotting, saturating and changing one pad every hour. . Inability to urinate 6 hours after discharge from hospital. . Pain not relieved by pain medication. . Fever of 100.4 F or greater. .  Unusual vaginal discharge or odor.  Return to office _________________Call for an appointment ___________________ Patient's signature: ______________________ Nurse's signature ________________________  Post Anesthesia Care Unit [phone]  
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
## 2011-09-02 NOTE — Op Note (Signed)
09/02/2011  10:24 AM  PATIENT:  [name] [name]  61 y.o. female  PRE-OPERATIVE DIAGNOSIS:  Post Menapausal Bleeding,Perineal Lesion  POST-OPERATIVE DIAGNOSIS:  Post Menapausal Bleeding,Perineal Lesion  PROCEDURE:  Procedure(s): DILATATION & CURETTAGE/HYSTEROSCOPY WITH  RESECTION Excision of perianal lesion  SURGEON:  Hal Morales, MD  ASSISTANTS:none  ANESTHESIA:   general  ESTIMATED BLOOD LOSS: minimal  BLOOD ADMINISTERED:none  COMPLICATIONS:none  FINDINGS: the uterus sounded to 7 cm. At hysteroscopy there were adhesions R. Cross the endocervix and across the endometrial cavity. The tubal ostia were never identified,probably because of significant intrauterine scarring.  FLUID DEFICIT:270cc  LOCAL MEDICATIONS USED:  XYLOCAINE   SPECIMEN:  Source of Specimen:  1)eendometrial adhesions and curettings               2)perianal lesion  DISPOSITION OF SPECIMEN:  PATHOLOGY  COUNTS:  YES  DESCRIPTION OF PROCEDURE:the patient was taken to the operating room after appropriate identification and placed on the operating table. After the attainment of adequate general anesthesia she was placed in the lithotomy position. The perineum and vagina were prepped with multiple layers of Betadine. The bladder was emptied with a an in and out catheter. The perineum was draped in sterile field. A Graves speculum was placed in the vagina. The cervix was grasped with a single-tooth tenaculum. A paracervical block was achieved with a total of 10 cc of 2% Xylocaine and the 5 and 7:00 positions. The uterus was sounded.  The cervix was then dilated to accommodate the diagnostic hysteroscope. The hysteroscope was used to evaluate all quadrants of the uterus.the above noted findings were documented.  The hysteroscope was then changed to the operative hysteroscope allowing resection of the adhesions that prevented the hysteroscope from being advanced further into the uterus. These were then removed with  sharp curettage. Hemostasis was noted to be adequate.  The perianal area was then inspected carefully with the finding of a 2 mm condyloma at the 2:00 position in the. Anal region. This was sharply excised after the infiltration of the area with 5 cc of 2% Xylocaine.  Hemostasis was achieved with Monsel solution.  The patient was given Toradol 15 mg IV and 15 mg IM. She was awakened from general anesthesia once all instruments had been removed from the vagina and perineum. She was taken to the recovery room in satisfactory condition, having tolerated the procedure well with sponge and instrument counts correct.    PLAN OF CARE:discharge home  PATIENT DISPOSITION:  PACU - hemodynamically stable.   Sequential compression devices were in place during the entire surgical procedure for DVT prophylaxis.   Hal Morales, MD 10:24 AM

## 2011-09-02 NOTE — Anesthesia Preprocedure Evaluation (Signed)
Anesthesia Evaluation  Patient identified by MRN, date of birth, ID band Patient awake    Reviewed: Allergy & Precautions, H&P , NPO status , Patient's Chart, lab work & pertinent test results  History of Anesthesia Complications (+) PROLONGED EMERGENCE  Airway Mallampati: II TM Distance: >3 FB Neck ROM: Full    Dental No notable dental hx. (+) Teeth Intact   Pulmonary shortness of breath and with exertion, asthma ,  breath sounds clear to auscultation        Cardiovascular negative cardio ROS  Rhythm:Regular Rate:Normal     Neuro/Psych  Headaches, Anxiety Depression    GI/Hepatic Neg liver ROS, GERD-  Medicated and Controlled,  Endo/Other  negative endocrine ROS  Renal/GU negative Renal ROS  negative genitourinary   Musculoskeletal negative musculoskeletal ROS (+)   Abdominal Normal abdominal exam  (+)   Peds  Hematology negative hematology ROS (+)   Anesthesia Other Findings   Reproductive/Obstetrics negative OB ROS                           Anesthesia Physical Anesthesia Plan  ASA: II  Anesthesia Plan: General   Post-op Pain Management:    Induction: Intravenous  Airway Management Planned: LMA  Additional Equipment:   Intra-op Plan:   Post-operative Plan: Extubation in OR  Informed Consent: I have reviewed the patients History and Physical, chart, labs and discussed the procedure including the risks, benefits and alternatives for the proposed anesthesia with the patient or authorized representative who has indicated his/her understanding and acceptance.   Dental advisory given  Plan Discussed with: CRNA, Anesthesiologist and Surgeon  Anesthesia Plan Comments:         Anesthesia Quick Evaluation

## 2011-09-03 LAB — URINE CULTURE
Colony Count: NO GROWTH
Organism ID, Bacteria: NO GROWTH

## 2011-09-05 ENCOUNTER — Encounter (HOSPITAL_COMMUNITY): Payer: Self-pay | Admitting: Obstetrics and Gynecology

## 2011-09-06 ENCOUNTER — Telehealth: Payer: Self-pay | Admitting: Obstetrics and Gynecology

## 2011-09-06 ENCOUNTER — Other Ambulatory Visit (HOSPITAL_COMMUNITY): Payer: Self-pay | Admitting: Psychiatry

## 2011-09-06 NOTE — Telephone Encounter (Signed)
Chandra/VPH pt 

## 2011-09-06 NOTE — Telephone Encounter (Signed)
Consulted with vph, pt to increase rest x 1week and cb if sx's have not improved. Pt voices understanding.

## 2011-09-06 NOTE — Telephone Encounter (Signed)
Tc from pt. C/o fatigue s/p D&C on 09/02/11. Hgb-13.3 on 08/17/11. Pt no having any bldg. Pt spent most of the day yesterday lying around and has tried to sit up for the most part of today. Will consult with vph rgdg any additional recs at this time. Pt agrees.

## 2011-09-21 ENCOUNTER — Encounter: Payer: 59 | Admitting: Obstetrics and Gynecology

## 2011-09-29 ENCOUNTER — Other Ambulatory Visit: Payer: Self-pay | Admitting: Family Medicine

## 2011-10-03 ENCOUNTER — Encounter: Payer: Self-pay | Admitting: Obstetrics and Gynecology

## 2011-10-03 ENCOUNTER — Ambulatory Visit (INDEPENDENT_AMBULATORY_CARE_PROVIDER_SITE_OTHER): Payer: 59 | Admitting: Obstetrics and Gynecology

## 2011-10-03 VITALS — BP 164/68 | Temp 97.7°F | Ht 65.0 in | Wt 144.0 lb

## 2011-10-03 DIAGNOSIS — Z202 Contact with and (suspected) exposure to infections with a predominantly sexual mode of transmission: Secondary | ICD-10-CM

## 2011-10-03 DIAGNOSIS — Z2089 Contact with and (suspected) exposure to other communicable diseases: Secondary | ICD-10-CM

## 2011-10-03 DIAGNOSIS — N898 Other specified noninflammatory disorders of vagina: Secondary | ICD-10-CM

## 2011-10-03 LAB — POCT WET PREP (WET MOUNT)
Clue Cells Wet Prep Whiff POC: NEGATIVE
pH: 5

## 2011-10-03 NOTE — Progress Notes (Signed)
DATE OF SURGERY: 09-02-11 TYPE OF SURGERY:DILATATION AND CURETTAGE /HYSTEROSCOPY PAIN:No VAG BLEEDING: no VAG DISCHARGE: yes c/o slight odor. No irritation. NORMAL GI FUNCTN: yes NORMAL GU FUNCTN: yes PATHOLOGY: Diagnosis 1. Endometrium, curettage - ABUNDANT BENIGN SMOOTH MUSCLE BUNDLES, CONSISTENT WITH FRAGMENTS OF LEIOMYOMA. - POLYPOID FRAGMENT OF BENIGN SECRETORY ENDOMETRIUM WITH NO EVIDENCE OF HYPERPLASIA OR MALIGNANCY. 2. Condyloma - CONDYLOMA ACUMINATUM  EXAM: BP 164/68  Temp 97.7 F (36.5 C)  Ht 5\' 5"  (1.651 m)  Wt 144 lb (65.318 kg)  BMI 23.96 kg/m2 PELVIC:,  VULVA: normal appearing vulva with no masses, tenderness or lesions,    VAGINA: , atrophic,    CERVIX: normal appearing cervix without discharge or lesions,    UTERUS: Upper limits normal soze,    ADNEXA: no masses IMPRESSION: Postmenopausal bleeding resolved.  Now s/p myoma resection  RECOMMENDATION: Follow up 1 year Pt requests HIV for presumed exposure .

## 2011-10-14 ENCOUNTER — Telehealth: Payer: Self-pay | Admitting: Obstetrics and Gynecology

## 2011-10-14 NOTE — Telephone Encounter (Signed)
Tc to pt per telephone call. Told pt HIV=non reactive(negative). Pt voices understanding.

## 2011-10-14 NOTE — Telephone Encounter (Signed)
CHANDRA/RES °

## 2011-10-31 ENCOUNTER — Other Ambulatory Visit: Payer: Self-pay | Admitting: Obstetrics and Gynecology

## 2011-10-31 NOTE — Telephone Encounter (Signed)
CHANDRA/EPIC

## 2011-11-01 MED ORDER — ESTRADIOL-LEVONORGESTREL 0.045-0.015 MG/DAY TD PTWK: 1.0000 | MEDICATED_PATCH | TRANSDERMAL | Status: DC

## 2011-11-01 NOTE — Telephone Encounter (Signed)
Tc to pt per telephone call. Rx for Climara Pro e-pres to pharm on file. Pt agrees.

## 2011-11-21 ENCOUNTER — Other Ambulatory Visit: Payer: Self-pay | Admitting: Obstetrics and Gynecology

## 2011-11-21 DIAGNOSIS — N6009 Solitary cyst of unspecified breast: Secondary | ICD-10-CM

## 2011-11-25 ENCOUNTER — Ambulatory Visit
Admission: RE | Admit: 2011-11-25 | Discharge: 2011-11-25 | Disposition: A | Discharge status: Home / Self Care | Payer: 59 | Source: Ambulatory Visit | Attending: Obstetrics and Gynecology | Admitting: Obstetrics and Gynecology

## 2011-11-25 ENCOUNTER — Encounter: Payer: Self-pay | Admitting: Obstetrics and Gynecology

## 2011-11-25 DIAGNOSIS — N6009 Solitary cyst of unspecified breast: Secondary | ICD-10-CM

## 2011-11-25 DIAGNOSIS — R922 Inconclusive mammogram: Secondary | ICD-10-CM | POA: Insufficient documentation

## 2011-12-23 ENCOUNTER — Telehealth: Payer: Self-pay | Admitting: Obstetrics and Gynecology

## 2011-12-23 NOTE — Telephone Encounter (Signed)
Tc from pt. Pt c/o,"an eczema-like rash" underneath her Climara Pro patches. Pt has had this occur on/off x couple years;however increasing in severity. Pt c/o mild itching @ patch site;occ turning into pus-filled bumps". Pt states,"this sometimes causes patch to detach from skin due to moisture. Pt opts to d/c Climara Pro patch for a couple of months and monitor menopausal sx's. Pt started HRT due to hot flashes. Pt declines consult with vph at this time. Informed pt will make vph aware. Pt to call office is no improvement or if menopausal sx's return. Pt voices understanding.

## 2011-12-26 NOTE — Telephone Encounter (Signed)
Agree pt to FU for aex 5/14 or prn

## 2012-06-21 ENCOUNTER — Encounter: Payer: Self-pay | Admitting: *Deleted

## 2012-07-11 ENCOUNTER — Ambulatory Visit (INDEPENDENT_AMBULATORY_CARE_PROVIDER_SITE_OTHER): Payer: 59 | Admitting: Family Medicine

## 2012-07-11 ENCOUNTER — Encounter: Payer: Self-pay | Admitting: Family Medicine

## 2012-07-11 VITALS — BP 120/76 | HR 80 | Ht 64.0 in | Wt 149.0 lb

## 2012-07-11 DIAGNOSIS — I658 Occlusion and stenosis of other precerebral arteries: Secondary | ICD-10-CM

## 2012-07-11 DIAGNOSIS — E559 Vitamin D deficiency, unspecified: Secondary | ICD-10-CM

## 2012-07-11 DIAGNOSIS — F419 Anxiety disorder, unspecified: Secondary | ICD-10-CM

## 2012-07-11 DIAGNOSIS — F331 Major depressive disorder, recurrent, moderate: Secondary | ICD-10-CM

## 2012-07-11 DIAGNOSIS — F32A Depression, unspecified: Secondary | ICD-10-CM

## 2012-07-11 DIAGNOSIS — I6529 Occlusion and stenosis of unspecified carotid artery: Secondary | ICD-10-CM

## 2012-07-11 DIAGNOSIS — F411 Generalized anxiety disorder: Secondary | ICD-10-CM

## 2012-07-11 DIAGNOSIS — E7800 Pure hypercholesterolemia, unspecified: Secondary | ICD-10-CM

## 2012-07-11 DIAGNOSIS — F329 Major depressive disorder, single episode, unspecified: Secondary | ICD-10-CM

## 2012-07-11 DIAGNOSIS — F3289 Other specified depressive episodes: Secondary | ICD-10-CM

## 2012-07-11 DIAGNOSIS — R5381 Other malaise: Secondary | ICD-10-CM

## 2012-07-11 DIAGNOSIS — R5383 Other fatigue: Secondary | ICD-10-CM

## 2012-07-11 DIAGNOSIS — I6523 Occlusion and stenosis of bilateral carotid arteries: Secondary | ICD-10-CM
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
## 2012-07-11 NOTE — Patient Instructions (Addendum)
Continue to follow with Dr. Toni Arthurs and work on finding the right regimen of medications.  Names mentioned today Lamictal, topamax (?adding if weight gain remains an issue).  Consider restarting therapy Berniece Andreas, vs other recommendations from Dr. Toni Arthurs)  We are going to schedule you for carotid artery ultrasound.  You may end up needing CT angiogram or referral to a vascular specialist.  Return for fasting labs.  In interim, you should be taking 1000 IU of Vitamin D3 daily.  If your numbers are very low, we will likely change that (possibly a prescription)  Return for a complete physical this summer/fall Return soon if any other problems develop

## 2012-07-12 ENCOUNTER — Other Ambulatory Visit: Payer: 59

## 2012-07-12 ENCOUNTER — Encounter: Payer: Self-pay | Admitting: Family Medicine

## 2012-07-12 ENCOUNTER — Other Ambulatory Visit: Payer: Self-pay | Admitting: Family Medicine

## 2012-07-12 DIAGNOSIS — E559 Vitamin D deficiency, unspecified: Secondary | ICD-10-CM

## 2012-07-12 DIAGNOSIS — F331 Major depressive disorder, recurrent, moderate: Secondary | ICD-10-CM

## 2012-07-12 DIAGNOSIS — R5381 Other malaise: Secondary | ICD-10-CM

## 2012-07-12 DIAGNOSIS — E7800 Pure hypercholesterolemia, unspecified: Secondary | ICD-10-CM

## 2012-07-13 LAB — CBC WITH DIFFERENTIAL/PLATELET
Basophils Absolute: 0 10*3/uL (ref 0.0–0.1)
HCT: 43.7 % (ref 36.0–46.0)
Lymphocytes Relative: 48 % — ABNORMAL HIGH (ref 12–46)
Lymphs Abs: 3 10*3/uL (ref 0.7–4.0)
Neutro Abs: 2.7 10*3/uL (ref 1.7–7.7)
Platelets: 263 10*3/uL (ref 150–400)
RBC: 4.8 MIL/uL (ref 3.87–5.11)
RDW: 14 % (ref 11.5–15.5)
WBC: 6.3 10*3/uL (ref 4.0–10.5)

## 2012-07-13 LAB — TSH: TSH: 1.48 u[IU]/mL (ref 0.350–4.500)

## 2012-07-13 LAB — COMPREHENSIVE METABOLIC PANEL
BUN: 19 mg/dL (ref 6–23)
CO2: 30 mEq/L (ref 19–32)
Creat: 0.88 mg/dL (ref 0.50–1.10)
Glucose, Bld: 113 mg/dL — ABNORMAL HIGH (ref 70–99)
Total Bilirubin: 0.5 mg/dL (ref 0.3–1.2)
Total Protein: 6.8 g/dL (ref 6.0–8.3)

## 2012-07-13 LAB — LIPID PANEL
HDL: 62 mg/dL (ref >39)
Total CHOL/HDL Ratio: 3.4 Ratio
Triglycerides: 77 mg/dL (ref <150)

## 2012-07-13 LAB — VITAMIN D 25 HYDROXY (VIT D DEFICIENCY, FRACTURES): Vit D, 25-Hydroxy: 22 ng/mL — ABNORMAL LOW (ref 30–89)

## 2012-07-13 LAB — HEMOGLOBIN A1C
Hgb A1c MFr Bld: 5.7 % — ABNORMAL HIGH (ref <5.7)
Mean Plasma Glucose: 117 mg/dL — ABNORMAL HIGH (ref <117)

## 2012-07-16 ENCOUNTER — Other Ambulatory Visit: Payer: Self-pay | Admitting: *Deleted

## 2012-07-16 DIAGNOSIS — E559 Vitamin D deficiency, unspecified: Secondary | ICD-10-CM

## 2012-07-16 MED ORDER — ERGOCALCIFEROL 1.25 MG (50000 UT) PO CAPS: 50000.0000 [IU] | ORAL_CAPSULE | ORAL | Status: DC

## 2012-07-17 ENCOUNTER — Other Ambulatory Visit: Payer: 59

## 2012-07-18 ENCOUNTER — Ambulatory Visit
Admission: RE | Admit: 2012-07-18 | Discharge: 2012-07-18 | Disposition: A | Discharge status: Home / Self Care | Payer: 59 | Source: Ambulatory Visit | Attending: Family Medicine | Admitting: Family Medicine

## 2012-07-18 DIAGNOSIS — I6523 Occlusion and stenosis of bilateral carotid arteries: Secondary | ICD-10-CM

## 2012-11-20 ENCOUNTER — Encounter: Payer: Self-pay | Admitting: Internal Medicine

## 2012-11-21 ENCOUNTER — Other Ambulatory Visit: Payer: 59

## 2012-11-26 ENCOUNTER — Ambulatory Visit (INDEPENDENT_AMBULATORY_CARE_PROVIDER_SITE_OTHER): Payer: 59 | Admitting: Family Medicine

## 2012-11-26 ENCOUNTER — Encounter: Payer: Self-pay | Admitting: Family Medicine

## 2012-11-26 VITALS — BP 128/68 | HR 68 | Ht 64.0 in | Wt 149.0 lb

## 2012-11-26 DIAGNOSIS — Z0000 Encounter for general adult medical examination without abnormal findings: Secondary | ICD-10-CM

## 2012-11-26 DIAGNOSIS — R7301 Impaired fasting glucose: Secondary | ICD-10-CM

## 2012-11-26 DIAGNOSIS — E559 Vitamin D deficiency, unspecified: Secondary | ICD-10-CM

## 2012-11-26 LAB — POCT URINALYSIS DIPSTICK
Bilirubin, UA: NEGATIVE
Ketones, UA: NEGATIVE
Leukocytes, UA: NEGATIVE
Protein, UA: NEGATIVE
Spec Grav, UA: 1.01
pH, UA: 5

## 2012-11-26 NOTE — Patient Instructions (Addendum)
HEALTH MAINTENANCE RECOMMENDATIONS:  It is recommended that you get at least 30 minutes of aerobic exercise at least 5 days/week (for weight loss, you may need as much as 60-90 minutes). This can be any activity that gets your heart rate up. This can be divided in 10-15 minute intervals if needed, but try and build up your endurance at least once a week.  Weight bearing exercise is also recommended twice weekly.  Eat a healthy diet with lots of vegetables, fruits and fiber.  "Colorful" foods have a lot of vitamins (ie green vegetables, tomatoes, red peppers, etc).  Limit sweet tea, regular sodas and alcoholic beverages, all of which has a lot of calories and sugar.  Up to 1 alcoholic drink daily may be beneficial for women (unless trying to lose weight, watch sugars).  Drink a lot of water.  Calcium recommendations are 1200-1500 mg daily (1500 mg for postmenopausal women or women without ovaries), and vitamin D 1000 IU daily.  This should be obtained from diet and/or supplements (vitamins), and calcium should not be taken all at once, but in divided doses.  Monthly self breast exams and yearly mammograms for women over the age of 28 is recommended.  Sunscreen of at least SPF 30 should be used on all sun-exposed parts of the skin when outside between the hours of 10 am and 4 pm (not just when at beach or pool, but even with exercise, golf, tennis, and yard work!)  Use a sunscreen that says "broad spectrum" so it covers both UVA and UVB rays, and make sure to reapply every 1-2 hours.  Remember to change the batteries in your smoke detectors when changing your clock times in the spring and fall.  Use your seat belt every time you are in a car, and please drive safely and not be distracted with cell phones and texting while driving.   Check with your insurance re: coverage for shingles vaccine.  If covered, you can return for a nurse visit.  You do not want this vaccine within a month of any other  shot.
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
## 2012-11-27 DIAGNOSIS — R7301 Impaired fasting glucose: Secondary | ICD-10-CM | POA: Insufficient documentation

## 2012-11-27 DIAGNOSIS — E559 Vitamin D deficiency, unspecified: Secondary | ICD-10-CM | POA: Insufficient documentation

## 2012-12-20 ENCOUNTER — Other Ambulatory Visit: Payer: Self-pay

## 2012-12-20 DIAGNOSIS — Z1231 Encounter for screening mammogram for malignant neoplasm of breast: Secondary | ICD-10-CM

## 2013-01-07 ENCOUNTER — Ambulatory Visit
Admission: RE | Admit: 2013-01-07 | Discharge: 2013-01-07 | Disposition: A | Discharge status: Home / Self Care | Payer: 59 | Source: Ambulatory Visit

## 2013-01-07 DIAGNOSIS — Z1231 Encounter for screening mammogram for malignant neoplasm of breast: Secondary | ICD-10-CM

## 2013-04-24 ENCOUNTER — Other Ambulatory Visit: Payer: Self-pay | Admitting: Allergy and Immunology

## 2013-04-24 DIAGNOSIS — M810 Age-related osteoporosis without current pathological fracture: Secondary | ICD-10-CM

## 2013-04-25 ENCOUNTER — Ambulatory Visit (HOSPITAL_COMMUNITY)
Admission: RE | Admit: 2013-04-25 | Discharge: 2013-04-25 | Disposition: A | Discharge status: Home / Self Care | Payer: 59 | Source: Ambulatory Visit | Attending: Allergy and Immunology | Admitting: Allergy and Immunology

## 2013-04-25 DIAGNOSIS — M810 Age-related osteoporosis without current pathological fracture: Secondary | ICD-10-CM

## 2013-04-25 DIAGNOSIS — Z13820 Encounter for screening for osteoporosis: Secondary | ICD-10-CM | POA: Insufficient documentation

## 2013-04-25 DIAGNOSIS — Z780 Asymptomatic menopausal state: Secondary | ICD-10-CM | POA: Insufficient documentation

## 2013-05-29 ENCOUNTER — Other Ambulatory Visit: Payer: Self-pay | Admitting: Internal Medicine

## 2013-05-29 MED ORDER — CIPROFLOXACIN HCL 250 MG PO TABS: 250.0000 mg | ORAL_TABLET | Freq: Two times a day (BID) | ORAL | Status: DC

## 2013-05-29 NOTE — Progress Notes (Signed)
uti sxt w/out fever or pain rx cipro then f/u if not responding

## 2013-08-08 ENCOUNTER — Telehealth: Payer: Self-pay | Admitting: *Deleted

## 2013-08-12 NOTE — Telephone Encounter (Signed)
Error

## 2013-09-18 ENCOUNTER — Telehealth: Payer: Self-pay | Admitting: *Deleted

## 2013-09-18 NOTE — Telephone Encounter (Signed)
noted 

## 2013-09-18 NOTE — Telephone Encounter (Signed)
I have left several messages (4) with no call back. Just an FYI.

## 2013-09-18 NOTE — Telephone Encounter (Signed)
Message copied by Clinton Sawyer on Wed Sep 18, 2013 12:01 PM ------      Message from: Rita Ohara      Created: Fri Aug 02, 2013  5:42 PM      Regarding: needs appt, labs       She was supposed to return in October 2014 for glucose, A1c and vitamin D.  Orders expired.  At this point, she is due for c-met, lipid, TSH along with the Vit D and A1c.  I extended the Vit D and A1c orders, canceled the glucose.  Please enter the TSH, c-met and lipids (v58.69, 272.0 for lipids).  She will be due for CPE in August.  Please schedule the CPE (fasting labs can be done now, and if something is abnormal, we will have her return to discuss results prior to her CPE). ------

## 2014-01-14 ENCOUNTER — Telehealth: Payer: Self-pay | Admitting: Family Medicine

## 2014-01-14 NOTE — Telephone Encounter (Signed)
Husband dropped off form for work stating pt had CPE 11/26/12 that just needs your signature, placed in your folder

## 2014-02-19 ENCOUNTER — Other Ambulatory Visit: Payer: Self-pay | Admitting: Internal Medicine

## 2014-02-19 MED ORDER — NITROFURANTOIN MONOHYD MACRO 100 MG PO CAPS: 100.0000 mg | ORAL_CAPSULE | Freq: Two times a day (BID) | ORAL | Status: DC

## 2014-02-19 NOTE — Progress Notes (Signed)
uti Meds ordered this encounter  Medications  . nitrofurantoin, macrocrystal-monohydrate, (MACROBID) 100 MG capsule    Sig: Take 1 capsule (100 mg total) by mouth 2 (two) times daily.    Dispense:  14 capsule    Refill:  0

## 2014-02-26 ENCOUNTER — Encounter: Payer: Self-pay | Admitting: Family Medicine

## 2014-02-26 ENCOUNTER — Ambulatory Visit (INDEPENDENT_AMBULATORY_CARE_PROVIDER_SITE_OTHER): Payer: 59 | Admitting: Family Medicine

## 2014-02-26 VITALS — BP 140/64 | HR 108 | Temp 97.7°F | Ht 64.0 in | Wt 151.0 lb

## 2014-02-26 DIAGNOSIS — L298 Other pruritus: Secondary | ICD-10-CM

## 2014-02-26 DIAGNOSIS — N898 Other specified noninflammatory disorders of vagina: Secondary | ICD-10-CM

## 2014-02-26 DIAGNOSIS — R011 Cardiac murmur, unspecified: Secondary | ICD-10-CM

## 2014-02-26 DIAGNOSIS — R350 Frequency of micturition: Secondary | ICD-10-CM

## 2014-02-26 LAB — POCT URINALYSIS DIPSTICK
BILIRUBIN UA: NEGATIVE
Blood, UA: NEGATIVE
GLUCOSE UA: NEGATIVE
Leukocytes, UA: NEGATIVE
NITRITE UA: NEGATIVE
Protein, UA: NEGATIVE
UROBILINOGEN UA: 2
pH, UA: 6

## 2014-02-26 NOTE — Patient Instructions (Signed)
Trial of Vagisil to help with irritation.  Follow up with GYN re: complaints of painful intercourse, or any ongoing symptoms.  We are going to arrange for an echocardiogram to evaluate the cause of the murmur heard on your exam today.

## 2014-02-26 NOTE — Progress Notes (Signed)
Chief Complaint  Patient presents with  . Urinary Frequency    feels like constant burning in urethra, also mentions itching-no discharge x 10 days.    10 days ago she started with burning in her urethra, itching and frequent urination. Symptoms started shortly after having intercourse.  She rarely has intercourse, due to it being painful, and it was painful.  No dysuria.  It was not similar to prior UTI's (of which she had many).  Took 1 week of macrobid (prescribed by a doctor friend), last dose was this morning.  She still feels itchy and "not right".  She also treated herself for a yeast infection with over the counter suppository like Monistat.  She used a one day treatment 4 days ago; hasn't noticed any improvement.  She denies any vaginal discharge.  She denies any new contacts (ie soaps, detergents).  PMH, PSH, SH were reviewed  Outpatient Encounter Prescriptions as of 02/26/2014  Medication Sig Note  . albuterol (PROVENTIL HFA;VENTOLIN HFA) 108 (90 BASE) MCG/ACT inhaler Inhale 2 puffs into the lungs every 6 (six) hours as needed. For shortness of breath   . cholecalciferol (VITAMIN D) 1000 UNITS tablet Take 1,000 Units by mouth daily.   . DULoxetine (CYMBALTA) 60 MG capsule Take 60 mg by mouth every morning.    . mometasone (ASMANEX) 220 MCG/INH inhaler Inhale 1 puff into the lungs 2 (two) times daily.   Earney Navy Bicarbonate (ZEGERID) 20-1100 MG CAPS Take 2 capsules by mouth daily before breakfast.    . ranitidine (ZANTAC) 300 MG tablet Take 300 mg by mouth at bedtime.    . [DISCONTINUED] OLANZapine (ZYPREXA) 5 MG tablet Take 2.5 mg by mouth at bedtime.    . cetirizine (ZYRTEC) 10 MG tablet Take 10 mg by mouth daily.   Marland Kitchen doxycycline (DORYX) 100 MG DR capsule Take 100 mg by mouth 2 (two) times daily. 07/11/2012: Takes as needed for flareup of rash on nose/perinasal dermatitis  . fluticasone (FLONASE) 50 MCG/ACT nasal spray Place 2 sprays into the nose daily.   Marland Kitchen ibuprofen  (ADVIL,MOTRIN) 600 MG tablet 600 mg by mouth every 6 hours for 3 days then 600 mg by mouth every 6 hours as needed for pain (Patient not taking: Reported on 02/26/2014)   . nitrofurantoin, macrocrystal-monohydrate, (MACROBID) 100 MG capsule Take 1 capsule (100 mg total) by mouth 2 (two) times daily. (Patient not taking: Reported on 02/26/2014)   . [DISCONTINUED] ALPRAZolam (XANAX) 0.5 MG tablet Take 1 tablet (0.5 mg total) by mouth at bedtime as needed for sleep. Needs office visit for more (Patient not taking: Reported on 02/26/2014)   . [DISCONTINUED] amphetamine-dextroamphetamine (ADDERALL) 10 MG tablet Take 10 mg by mouth daily.    . [DISCONTINUED] ciprofloxacin (CIPRO) 250 MG tablet Take 1 tablet (250 mg total) by mouth 2 (two) times daily.    Allergies  Allergen Reactions  . Sulfa Antibiotics Nausea Only   ROS:  Denies fevers, chills, nausea, vomiting, diarrhea, rashes, bleeding/bruising.  Denies URI or allergy symptoms. No chest pain, palpitations, shortness of breath. See HPI  PHYSICAL EXAM: BP 140/64 mmHg  Pulse 108  Temp(Src) 97.7 F (36.5 C) (Tympanic)  Ht 5\' 4"  (1.626 m)  Wt 151 lb (68.493 kg)  BMI 25.91 kg/m2  Pleasant female in no distress Neck: no lymphadenopathy, thyromegaly or mass Heart: regular rate and rhythm.  2-3/6 SEM noted, which radiates to both carotids Lungs: clear bilaterally Abdomen: soft, very mild discomfort in suprapubic area.  nontender elsewhere, no masses Extremities:  no edema, 2+ pulses External genitalia: mild erythema over the external vaginal area (hair-bearing area of labia majora).  Tissue otherwise appears normal--no lesions, discharge, or significant atrophy noted. Nontender.  Area of itching and discomfort is over the area that was pink. Psych: normal mood, affect, hygiene and grooming Neuro: alert and oriented, cranial nerves intact. Normal strength, gait, sensation  Urine dip:  Trace ketones and urobilinogen, otherwise normal.  SG >1.030  (very concentrated).  ASSESSMENT/PLAN:  Vaginal itching  Urinary frequency - Plan: POCT Urinalysis Dipstick  Heart murmur - Plan: 2D Echocardiogram without contrast  External vaginal itching:  No evidence of yeast infection.  Urinalysis isn't consistent with UTI (nor is the history). Can't send for culture--took ABX dose this morning. Likely related to irritation related to recent intercourse (rather than from other contacts). Trial of Vagisil to help with irritation.  F/u with GYN re: complaints of painful intercourse, or any ongoing symptoms.  Heart murmur.  Check echo to evaluate for aortic stenosis vs sclerosis.  Refuses flu shot  Encouraged her to schedule wellness exam (she is 63, NOT on medicare)--needs vaccines (pneumonia)

## 2014-03-03 ENCOUNTER — Ambulatory Visit (HOSPITAL_COMMUNITY): Payer: 59 | Attending: Family Medicine | Admitting: Radiology

## 2014-03-03 DIAGNOSIS — R011 Cardiac murmur, unspecified: Secondary | ICD-10-CM | POA: Insufficient documentation

## 2014-03-03 MED ORDER — PERFLUTREN LIPID MICROSPHERE
2.0000 mL | Freq: Once | INTRAVENOUS | Status: AC
  Administered 2014-03-03: 2 mL via INTRAVENOUS

## 2014-03-03 NOTE — Addendum Note (Signed)
Addended by: Gretta Began on: 03/03/2014 05:01 PM   Modules accepted: Orders

## 2014-03-03 NOTE — Progress Notes (Signed)
Echocardiogram performed.  

## 2014-03-05 ENCOUNTER — Telehealth: Payer: Self-pay | Admitting: Family Medicine

## 2014-03-05 NOTE — Telephone Encounter (Signed)
Pt called to get @D  Echo results. I gave her Dr Johnsie Kindred instructions

## 2014-04-05 ENCOUNTER — Encounter: Payer: Self-pay | Admitting: Internal Medicine

## 2014-04-28 LAB — HM PAP SMEAR: HM Pap smear: NORMAL

## 2014-06-05 ENCOUNTER — Encounter: Payer: Self-pay | Admitting: *Deleted

## 2014-07-02 HISTORY — PX: COLONOSCOPY: SHX174

## 2014-07-23 ENCOUNTER — Other Ambulatory Visit: Payer: Self-pay

## 2014-07-23 DIAGNOSIS — Z1231 Encounter for screening mammogram for malignant neoplasm of breast: Secondary | ICD-10-CM

## 2014-08-07 ENCOUNTER — Ambulatory Visit: Payer: Self-pay

## 2014-08-11 ENCOUNTER — Telehealth: Payer: Self-pay | Admitting: *Deleted

## 2014-08-11 NOTE — Telephone Encounter (Signed)
-----   Message from Rita Ohara, MD sent at 08/08/2014 12:33 PM EDT ----- She needs to schedule physical (IPPE only if she has gone on Medicare and is within the first 12 mos of being on Medicare;she had private insurance last time). We can due her labs at her physical.  Last was 11/2012. She needs immunizations and labs as part of her visit.  (she doesn't need to come prior.  A1c and Vit D have been in the system for over a year) ----- Message -----    From: SYSTEM    Sent: 08/08/2014  12:04 AM      To: Rita Ohara, MD

## 2014-08-11 NOTE — Telephone Encounter (Signed)
Left message for patient to call and schedule CPE or IPPE.

## 2014-08-14 ENCOUNTER — Ambulatory Visit
Admission: RE | Admit: 2014-08-14 | Discharge: 2014-08-14 | Disposition: A | Discharge status: Home / Self Care | Payer: 59 | Source: Ambulatory Visit

## 2014-08-14 DIAGNOSIS — Z1231 Encounter for screening mammogram for malignant neoplasm of breast: Secondary | ICD-10-CM

## 2014-09-19 ENCOUNTER — Telehealth: Payer: Self-pay | Admitting: Internal Medicine

## 2014-09-19 NOTE — Telephone Encounter (Signed)
Faxed over medical records to [facility] @ [phone] today 6/17

## 2014-09-29 ENCOUNTER — Other Ambulatory Visit: Payer: Self-pay

## 2014-10-27 ENCOUNTER — Encounter: Payer: Self-pay | Admitting: *Deleted

## 2014-10-30 ENCOUNTER — Encounter: Payer: Self-pay | Admitting: Family Medicine

## 2015-02-03 DIAGNOSIS — D126 Benign neoplasm of colon, unspecified: Secondary | ICD-10-CM

## 2015-02-03 HISTORY — DX: Benign neoplasm of colon, unspecified: D12.6

## 2015-02-20 LAB — HM COLONOSCOPY

## 2015-02-25 ENCOUNTER — Encounter: Payer: Self-pay | Admitting: Family Medicine

## 2015-02-25 DIAGNOSIS — D126 Benign neoplasm of colon, unspecified: Secondary | ICD-10-CM | POA: Insufficient documentation

## 2015-03-03 ENCOUNTER — Encounter: Payer: Self-pay | Admitting: Family Medicine

## 2015-08-18 ENCOUNTER — Ambulatory Visit (INDEPENDENT_AMBULATORY_CARE_PROVIDER_SITE_OTHER): Payer: BLUE CROSS/BLUE SHIELD | Admitting: Allergy and Immunology

## 2015-08-18 ENCOUNTER — Encounter: Payer: Self-pay | Admitting: Allergy and Immunology

## 2015-08-18 VITALS — BP 150/70 | HR 72 | Resp 20 | Ht 64.0 in | Wt 156.8 lb

## 2015-08-18 DIAGNOSIS — K219 Gastro-esophageal reflux disease without esophagitis: Secondary | ICD-10-CM

## 2015-08-18 DIAGNOSIS — H1010 Acute atopic conjunctivitis, unspecified eye: Secondary | ICD-10-CM

## 2015-08-18 DIAGNOSIS — J309 Allergic rhinitis, unspecified: Secondary | ICD-10-CM | POA: Diagnosis not present

## 2015-08-18 DIAGNOSIS — J4530 Mild persistent asthma, uncomplicated: Secondary | ICD-10-CM | POA: Diagnosis not present

## 2015-08-18 DIAGNOSIS — J387 Other diseases of larynx: Secondary | ICD-10-CM | POA: Diagnosis not present

## 2015-08-18 NOTE — Patient Instructions (Signed)
  1. Continue Asmanex 220 one inhalation once a day and increase to 2 inhalations twice a day as part of action plan for asthma flare  2. Continue Flonase one-2 sprays each nostril 3-7 times per week  3. Continue Zegerid 40 mg in morning and ranitidine 300 mg in evening  4. Continue Ventolin HFA 2 puffs every 4-6 hours if needed  5. Annual fall flu vaccine  6. Return to clinic in 1 year or earlier if problem
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
## 2015-08-26 ENCOUNTER — Other Ambulatory Visit: Payer: Self-pay

## 2015-08-26 MED ORDER — MOMETASONE FUROATE 220 MCG/INH IN AEPB: INHALATION_SPRAY | RESPIRATORY_TRACT | Status: DC

## 2015-09-03 DIAGNOSIS — R000 Tachycardia, unspecified: Secondary | ICD-10-CM | POA: Diagnosis not present

## 2015-09-03 DIAGNOSIS — R0609 Other forms of dyspnea: Secondary | ICD-10-CM | POA: Diagnosis not present

## 2015-09-03 DIAGNOSIS — Q248 Other specified congenital malformations of heart: Secondary | ICD-10-CM | POA: Diagnosis not present

## 2015-09-03 DIAGNOSIS — I10 Essential (primary) hypertension: Secondary | ICD-10-CM | POA: Diagnosis not present

## 2015-11-30 DIAGNOSIS — Q248 Other specified congenital malformations of heart: Secondary | ICD-10-CM | POA: Diagnosis not present

## 2015-11-30 DIAGNOSIS — R000 Tachycardia, unspecified: Secondary | ICD-10-CM | POA: Diagnosis not present

## 2015-11-30 DIAGNOSIS — R0609 Other forms of dyspnea: Secondary | ICD-10-CM | POA: Diagnosis not present

## 2015-11-30 DIAGNOSIS — I10 Essential (primary) hypertension: Secondary | ICD-10-CM | POA: Diagnosis not present

## 2015-12-15 DIAGNOSIS — I10 Essential (primary) hypertension: Secondary | ICD-10-CM | POA: Diagnosis not present

## 2015-12-15 DIAGNOSIS — I421 Obstructive hypertrophic cardiomyopathy: Secondary | ICD-10-CM | POA: Diagnosis not present

## 2015-12-15 DIAGNOSIS — Q248 Other specified congenital malformations of heart: Secondary | ICD-10-CM | POA: Diagnosis not present

## 2015-12-15 DIAGNOSIS — R0609 Other forms of dyspnea: Secondary | ICD-10-CM | POA: Diagnosis not present

## 2016-01-05 DIAGNOSIS — S90221A Contusion of right lesser toe(s) with damage to nail, initial encounter: Secondary | ICD-10-CM | POA: Diagnosis not present

## 2016-01-05 DIAGNOSIS — Z23 Encounter for immunization: Secondary | ICD-10-CM | POA: Diagnosis not present

## 2016-02-04 DIAGNOSIS — Z01419 Encounter for gynecological examination (general) (routine) without abnormal findings: Secondary | ICD-10-CM | POA: Diagnosis not present

## 2016-02-04 DIAGNOSIS — N952 Postmenopausal atrophic vaginitis: Secondary | ICD-10-CM | POA: Diagnosis not present

## 2016-02-04 DIAGNOSIS — Z6828 Body mass index (BMI) 28.0-28.9, adult: Secondary | ICD-10-CM | POA: Diagnosis not present

## 2016-03-14 DIAGNOSIS — I422 Other hypertrophic cardiomyopathy: Secondary | ICD-10-CM | POA: Diagnosis not present

## 2016-03-22 DIAGNOSIS — I422 Other hypertrophic cardiomyopathy: Secondary | ICD-10-CM | POA: Diagnosis not present

## 2016-03-22 DIAGNOSIS — R0602 Shortness of breath: Secondary | ICD-10-CM | POA: Diagnosis not present

## 2016-03-24 DIAGNOSIS — I422 Other hypertrophic cardiomyopathy: Secondary | ICD-10-CM | POA: Diagnosis not present

## 2016-06-15 DIAGNOSIS — I10 Essential (primary) hypertension: Secondary | ICD-10-CM | POA: Diagnosis not present

## 2016-06-15 DIAGNOSIS — E7800 Pure hypercholesterolemia, unspecified: Secondary | ICD-10-CM | POA: Diagnosis not present

## 2016-06-15 DIAGNOSIS — R000 Tachycardia, unspecified: Secondary | ICD-10-CM | POA: Diagnosis not present

## 2016-06-16 DIAGNOSIS — E7800 Pure hypercholesterolemia, unspecified: Secondary | ICD-10-CM | POA: Diagnosis not present

## 2016-06-16 DIAGNOSIS — R0609 Other forms of dyspnea: Secondary | ICD-10-CM | POA: Diagnosis not present

## 2016-06-16 DIAGNOSIS — I10 Essential (primary) hypertension: Secondary | ICD-10-CM | POA: Diagnosis not present

## 2016-06-16 DIAGNOSIS — Q248 Other specified congenital malformations of heart: Secondary | ICD-10-CM | POA: Diagnosis not present

## 2016-06-16 DIAGNOSIS — I421 Obstructive hypertrophic cardiomyopathy: Secondary | ICD-10-CM | POA: Diagnosis not present

## 2016-08-24 DIAGNOSIS — R202 Paresthesia of skin: Secondary | ICD-10-CM | POA: Diagnosis not present

## 2016-08-24 DIAGNOSIS — L304 Erythema intertrigo: Secondary | ICD-10-CM | POA: Diagnosis not present

## 2016-08-24 DIAGNOSIS — L299 Pruritus, unspecified: Secondary | ICD-10-CM | POA: Diagnosis not present

## 2016-09-12 DIAGNOSIS — R635 Abnormal weight gain: Secondary | ICD-10-CM | POA: Diagnosis not present

## 2016-09-12 DIAGNOSIS — Q248 Other specified congenital malformations of heart: Secondary | ICD-10-CM | POA: Diagnosis not present

## 2016-09-12 DIAGNOSIS — I10 Essential (primary) hypertension: Secondary | ICD-10-CM | POA: Diagnosis not present

## 2016-10-11 DIAGNOSIS — I421 Obstructive hypertrophic cardiomyopathy: Secondary | ICD-10-CM | POA: Diagnosis not present

## 2016-10-11 DIAGNOSIS — Q248 Other specified congenital malformations of heart: Secondary | ICD-10-CM | POA: Diagnosis not present

## 2016-10-11 DIAGNOSIS — E7800 Pure hypercholesterolemia, unspecified: Secondary | ICD-10-CM | POA: Diagnosis not present

## 2016-10-11 DIAGNOSIS — R0609 Other forms of dyspnea: Secondary | ICD-10-CM | POA: Diagnosis not present

## 2016-11-01 DIAGNOSIS — R0609 Other forms of dyspnea: Secondary | ICD-10-CM | POA: Diagnosis not present

## 2016-11-09 DIAGNOSIS — Q248 Other specified congenital malformations of heart: Secondary | ICD-10-CM | POA: Diagnosis not present

## 2016-11-09 DIAGNOSIS — R0609 Other forms of dyspnea: Secondary | ICD-10-CM | POA: Diagnosis not present

## 2016-11-09 DIAGNOSIS — R252 Cramp and spasm: Secondary | ICD-10-CM | POA: Diagnosis not present

## 2016-11-09 DIAGNOSIS — I421 Obstructive hypertrophic cardiomyopathy: Secondary | ICD-10-CM | POA: Diagnosis not present

## 2016-11-30 DIAGNOSIS — R252 Cramp and spasm: Secondary | ICD-10-CM | POA: Diagnosis not present

## 2016-12-22 DIAGNOSIS — F3181 Bipolar II disorder: Secondary | ICD-10-CM | POA: Diagnosis not present

## 2017-01-23 DIAGNOSIS — Z23 Encounter for immunization: Secondary | ICD-10-CM | POA: Diagnosis not present

## 2017-01-31 DIAGNOSIS — Q248 Other specified congenital malformations of heart: Secondary | ICD-10-CM | POA: Diagnosis not present

## 2017-01-31 DIAGNOSIS — I421 Obstructive hypertrophic cardiomyopathy: Secondary | ICD-10-CM | POA: Diagnosis not present

## 2017-01-31 DIAGNOSIS — I10 Essential (primary) hypertension: Secondary | ICD-10-CM | POA: Diagnosis not present

## 2017-01-31 DIAGNOSIS — R0609 Other forms of dyspnea: Secondary | ICD-10-CM | POA: Diagnosis not present

## 2017-02-06 DIAGNOSIS — Z23 Encounter for immunization: Secondary | ICD-10-CM | POA: Diagnosis not present

## 2017-02-06 DIAGNOSIS — L603 Nail dystrophy: Secondary | ICD-10-CM | POA: Diagnosis not present

## 2017-02-15 DIAGNOSIS — I10 Essential (primary) hypertension: Secondary | ICD-10-CM | POA: Diagnosis not present

## 2017-02-15 DIAGNOSIS — I421 Obstructive hypertrophic cardiomyopathy: Secondary | ICD-10-CM | POA: Diagnosis not present

## 2017-02-15 DIAGNOSIS — R0609 Other forms of dyspnea: Secondary | ICD-10-CM | POA: Diagnosis not present

## 2017-02-15 DIAGNOSIS — Q248 Other specified congenital malformations of heart: Secondary | ICD-10-CM | POA: Diagnosis not present

## 2017-04-06 DIAGNOSIS — I10 Essential (primary) hypertension: Secondary | ICD-10-CM | POA: Diagnosis not present

## 2017-04-06 DIAGNOSIS — Q248 Other specified congenital malformations of heart: Secondary | ICD-10-CM | POA: Diagnosis not present

## 2017-04-06 DIAGNOSIS — R0609 Other forms of dyspnea: Secondary | ICD-10-CM | POA: Diagnosis not present

## 2017-04-06 DIAGNOSIS — I421 Obstructive hypertrophic cardiomyopathy: Secondary | ICD-10-CM | POA: Diagnosis not present

## 2017-06-06 DIAGNOSIS — R194 Change in bowel habit: Secondary | ICD-10-CM | POA: Diagnosis not present

## 2017-06-06 DIAGNOSIS — R112 Nausea with vomiting, unspecified: Secondary | ICD-10-CM | POA: Diagnosis not present

## 2017-06-06 DIAGNOSIS — K5904 Chronic idiopathic constipation: Secondary | ICD-10-CM | POA: Diagnosis not present

## 2017-06-06 DIAGNOSIS — K219 Gastro-esophageal reflux disease without esophagitis: Secondary | ICD-10-CM | POA: Diagnosis not present

## 2017-07-04 DIAGNOSIS — Z800 Family history of malignant neoplasm of digestive organs: Secondary | ICD-10-CM | POA: Diagnosis not present

## 2017-07-04 DIAGNOSIS — K5904 Chronic idiopathic constipation: Secondary | ICD-10-CM | POA: Diagnosis not present

## 2017-07-04 DIAGNOSIS — K219 Gastro-esophageal reflux disease without esophagitis: Secondary | ICD-10-CM | POA: Diagnosis not present

## 2017-10-04 DIAGNOSIS — R0609 Other forms of dyspnea: Secondary | ICD-10-CM | POA: Diagnosis not present

## 2017-10-04 DIAGNOSIS — I421 Obstructive hypertrophic cardiomyopathy: Secondary | ICD-10-CM | POA: Diagnosis not present

## 2017-10-04 DIAGNOSIS — I10 Essential (primary) hypertension: Secondary | ICD-10-CM | POA: Diagnosis not present

## 2017-10-04 DIAGNOSIS — Q248 Other specified congenital malformations of heart: Secondary | ICD-10-CM | POA: Diagnosis not present

## 2017-12-13 DIAGNOSIS — I421 Obstructive hypertrophic cardiomyopathy: Secondary | ICD-10-CM | POA: Diagnosis not present

## 2017-12-13 DIAGNOSIS — R0609 Other forms of dyspnea: Secondary | ICD-10-CM | POA: Diagnosis not present

## 2017-12-13 DIAGNOSIS — R110 Nausea: Secondary | ICD-10-CM | POA: Diagnosis not present

## 2017-12-13 DIAGNOSIS — Q248 Other specified congenital malformations of heart: Secondary | ICD-10-CM | POA: Diagnosis not present

## 2018-01-06 DIAGNOSIS — Z23 Encounter for immunization: Secondary | ICD-10-CM | POA: Diagnosis not present

## 2018-02-06 DIAGNOSIS — R194 Change in bowel habit: Secondary | ICD-10-CM | POA: Diagnosis not present

## 2018-02-06 DIAGNOSIS — K219 Gastro-esophageal reflux disease without esophagitis: Secondary | ICD-10-CM | POA: Diagnosis not present

## 2018-02-06 DIAGNOSIS — Z1211 Encounter for screening for malignant neoplasm of colon: Secondary | ICD-10-CM | POA: Diagnosis not present

## 2018-02-06 DIAGNOSIS — R112 Nausea with vomiting, unspecified: Secondary | ICD-10-CM | POA: Diagnosis not present

## 2018-02-07 ENCOUNTER — Other Ambulatory Visit: Payer: Self-pay | Admitting: Gastroenterology

## 2018-02-07 DIAGNOSIS — R112 Nausea with vomiting, unspecified: Secondary | ICD-10-CM

## 2018-02-12 ENCOUNTER — Ambulatory Visit
Admission: RE | Admit: 2018-02-12 | Discharge: 2018-02-12 | Disposition: A | Discharge status: Home / Self Care | Payer: BLUE CROSS/BLUE SHIELD | Source: Ambulatory Visit | Attending: Gastroenterology | Admitting: Gastroenterology

## 2018-02-12 DIAGNOSIS — K760 Fatty (change of) liver, not elsewhere classified: Secondary | ICD-10-CM | POA: Diagnosis not present

## 2018-02-12 DIAGNOSIS — R112 Nausea with vomiting, unspecified: Secondary | ICD-10-CM

## 2018-02-12 MED ORDER — IOPAMIDOL (ISOVUE-300) INJECTION 61%
100.0000 mL | Freq: Once | INTRAVENOUS | Status: AC | PRN
  Administered 2018-02-12: 100 mL via INTRAVENOUS

## 2018-03-12 DIAGNOSIS — R194 Change in bowel habit: Secondary | ICD-10-CM | POA: Diagnosis not present

## 2018-03-12 DIAGNOSIS — K3189 Other diseases of stomach and duodenum: Secondary | ICD-10-CM | POA: Diagnosis not present

## 2018-03-12 DIAGNOSIS — Z800 Family history of malignant neoplasm of digestive organs: Secondary | ICD-10-CM | POA: Diagnosis not present

## 2018-03-12 DIAGNOSIS — K219 Gastro-esophageal reflux disease without esophagitis: Secondary | ICD-10-CM | POA: Diagnosis not present

## 2018-03-12 DIAGNOSIS — Z1211 Encounter for screening for malignant neoplasm of colon: Secondary | ICD-10-CM | POA: Diagnosis not present

## 2018-03-12 DIAGNOSIS — K6389 Other specified diseases of intestine: Secondary | ICD-10-CM | POA: Diagnosis not present

## 2018-03-12 DIAGNOSIS — K317 Polyp of stomach and duodenum: Secondary | ICD-10-CM | POA: Diagnosis not present

## 2018-03-22 DIAGNOSIS — R110 Nausea: Secondary | ICD-10-CM | POA: Diagnosis not present

## 2018-03-22 DIAGNOSIS — I421 Obstructive hypertrophic cardiomyopathy: Secondary | ICD-10-CM | POA: Diagnosis not present

## 2018-03-22 DIAGNOSIS — I10 Essential (primary) hypertension: Secondary | ICD-10-CM | POA: Diagnosis not present

## 2018-03-22 DIAGNOSIS — Q248 Other specified congenital malformations of heart: Secondary | ICD-10-CM | POA: Diagnosis not present

## 2018-04-25 DIAGNOSIS — I10 Essential (primary) hypertension: Secondary | ICD-10-CM | POA: Diagnosis not present

## 2018-04-25 DIAGNOSIS — J0100 Acute maxillary sinusitis, unspecified: Secondary | ICD-10-CM | POA: Diagnosis not present

## 2018-04-25 DIAGNOSIS — R61 Generalized hyperhidrosis: Secondary | ICD-10-CM | POA: Diagnosis not present

## 2018-05-07 DIAGNOSIS — E559 Vitamin D deficiency, unspecified: Secondary | ICD-10-CM | POA: Diagnosis not present

## 2018-05-07 DIAGNOSIS — R61 Generalized hyperhidrosis: Secondary | ICD-10-CM | POA: Diagnosis not present

## 2018-05-07 DIAGNOSIS — J019 Acute sinusitis, unspecified: Secondary | ICD-10-CM | POA: Diagnosis not present

## 2018-05-07 DIAGNOSIS — K529 Noninfective gastroenteritis and colitis, unspecified: Secondary | ICD-10-CM | POA: Diagnosis not present

## 2018-05-07 DIAGNOSIS — R634 Abnormal weight loss: Secondary | ICD-10-CM | POA: Diagnosis not present

## 2018-07-03 ENCOUNTER — Telehealth: Payer: Self-pay

## 2018-07-03 MED ORDER — ONDANSETRON HCL 4 MG PO TABS: 4.0000 mg | ORAL_TABLET | Freq: Two times a day (BID) | ORAL | 0 refills | Status: DC | PRN

## 2018-07-03 NOTE — Telephone Encounter (Signed)
Pt aware. New rx sent to pharmacy.//ah

## 2018-07-03 NOTE — Telephone Encounter (Signed)
She can start medication and We can send Zofran 4 mg to be taken PRN BID

## 2018-07-03 NOTE — Telephone Encounter (Signed)
Pt called stating that she was taking off of Metoprolol 25mg  back in Nov due to having nausea and vomiting. She is having SOB and feels she needs to restart it. Is it ok to restart and if so she wants to know if you can send something in for nausea besides Meclizine. Please advise.//ah

## 2018-08-06 ENCOUNTER — Telehealth: Payer: Self-pay

## 2018-08-06 NOTE — Telephone Encounter (Signed)
Pt called and stated that she has been having a hard time recovering from Reparatory issues she has had since December, she says she has no energy and spends most of her day laying down and wants you to see her.

## 2018-08-06 NOTE — Telephone Encounter (Signed)
Sure

## 2018-08-06 NOTE — Telephone Encounter (Signed)
Can you call pt and make her an appointment per [facility]

## 2018-08-08 ENCOUNTER — Encounter: Payer: Self-pay | Admitting: Cardiology

## 2018-08-08 ENCOUNTER — Ambulatory Visit (INDEPENDENT_AMBULATORY_CARE_PROVIDER_SITE_OTHER): Payer: BC Managed Care – PPO | Admitting: Cardiology

## 2018-08-08 ENCOUNTER — Other Ambulatory Visit: Payer: Self-pay

## 2018-08-08 VITALS — BP 130/81 | HR 101 | Ht 64.0 in | Wt 143.5 lb

## 2018-08-08 DIAGNOSIS — R5381 Other malaise: Secondary | ICD-10-CM | POA: Diagnosis not present

## 2018-08-08 DIAGNOSIS — R0609 Other forms of dyspnea: Secondary | ICD-10-CM | POA: Diagnosis not present

## 2018-08-08 DIAGNOSIS — R0600 Dyspnea, unspecified: Secondary | ICD-10-CM

## 2018-08-08 DIAGNOSIS — I10 Essential (primary) hypertension: Secondary | ICD-10-CM | POA: Diagnosis not present

## 2018-08-08 DIAGNOSIS — I421 Obstructive hypertrophic cardiomyopathy: Secondary | ICD-10-CM | POA: Insufficient documentation

## 2018-08-08 DIAGNOSIS — R5383 Other fatigue: Secondary | ICD-10-CM

## 2018-08-08 DIAGNOSIS — R0683 Snoring: Secondary | ICD-10-CM
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
## 2018-08-09 ENCOUNTER — Other Ambulatory Visit: Payer: Self-pay | Admitting: Cardiology

## 2018-08-09 DIAGNOSIS — R0609 Other forms of dyspnea: Principal | ICD-10-CM

## 2018-08-09 DIAGNOSIS — R0600 Dyspnea, unspecified: Secondary | ICD-10-CM

## 2018-08-16 ENCOUNTER — Telehealth: Payer: Self-pay | Admitting: Neurology

## 2018-08-16 NOTE — Telephone Encounter (Signed)
Due to current COVID 19 pandemic, our office is severely reducing in office visits, in order to minimize the risk to our patients and healthcare providers.    Pt understands that although there may be some limitations with this type of visit, we will take all precautions to reduce any security or privacy concerns.  Pt understands that this will be treated like an in office visit and we will file with pt's insurance, and there may be a patient responsible charge related to this service.  Pt's email is [email or web address] Pt will be using [email or web address] for their virtual visit. Pt understands that the nurse will be calling to go over pt's chart.

## 2018-08-20 DIAGNOSIS — E559 Vitamin D deficiency, unspecified: Secondary | ICD-10-CM | POA: Diagnosis not present

## 2018-08-20 DIAGNOSIS — I10 Essential (primary) hypertension: Secondary | ICD-10-CM | POA: Diagnosis not present

## 2018-08-21 ENCOUNTER — Institutional Professional Consult (permissible substitution): Payer: BLUE CROSS/BLUE SHIELD | Admitting: Neurology

## 2018-08-21 NOTE — Telephone Encounter (Signed)
Patient returning your calling [phone]

## 2018-08-21 NOTE — Telephone Encounter (Signed)
I called pt to update her chart. No answer, left a message asking her to call me back. 

## 2018-08-21 NOTE — Telephone Encounter (Signed)
I called pt. Pt's meds, allergies, and PMH were updated.  Pt reports that she has never had a sleep study but does endorse snoring.  Pt reports that her weight is 143 lbs and she is 5'4.   Pt was instructed on how to measure her neck size prior to her appt.  Pt did receive the email with the link to [email or web address] and understands the [email or web address] process.  Epworth Sleepiness Scale 0= would never doze 1= slight chance of dozing 2= moderate chance of dozing 3= high chance of dozing  Sitting and reading: 0 Watching TV: 0 Sitting inactive in a public place (ex. Theater or meeting): 0 As a passenger in a car for an hour without a break: 0 Lying down to rest in the afternoon: 2 Sitting and talking to someone: 0 Sitting quietly after lunch (no alcohol): 0 In a car, while stopped in traffic: 0 Total: 2  FSS: 53

## 2018-08-22 ENCOUNTER — Encounter: Payer: Self-pay | Admitting: Neurology

## 2018-08-22 ENCOUNTER — Ambulatory Visit (INDEPENDENT_AMBULATORY_CARE_PROVIDER_SITE_OTHER): Payer: BLUE CROSS/BLUE SHIELD | Admitting: Neurology

## 2018-08-22 ENCOUNTER — Other Ambulatory Visit: Payer: Self-pay

## 2018-08-22 DIAGNOSIS — Z820 Family history of epilepsy and other diseases of the nervous system: Secondary | ICD-10-CM

## 2018-08-22 DIAGNOSIS — R0683 Snoring: Secondary | ICD-10-CM | POA: Diagnosis not present

## 2018-08-22 DIAGNOSIS — G479 Sleep disorder, unspecified: Secondary | ICD-10-CM | POA: Diagnosis not present

## 2018-08-22 DIAGNOSIS — I421 Obstructive hypertrophic cardiomyopathy: Secondary | ICD-10-CM

## 2018-08-22 DIAGNOSIS — G4700 Insomnia, unspecified: Secondary | ICD-10-CM

## 2018-08-22 DIAGNOSIS — G2581 Restless legs syndrome: Secondary | ICD-10-CM
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
## 2018-08-22 NOTE — Patient Instructions (Signed)
Given verbally, during today's virtual video-based encounter, with verbal feedback received.   

## 2018-08-29 ENCOUNTER — Telehealth: Payer: Self-pay | Admitting: Neurology

## 2018-08-29 NOTE — Telephone Encounter (Signed)
Meagan: Please call patient: she may qualify for an in lab study and we could wait to bring her in at a later date, if she feels more Comfortable waiting things out a little longer.  Please offer her an in lab study if she is agreeable, we could do this at her convenience at a later date.

## 2018-08-29 NOTE — Telephone Encounter (Signed)
Pt has called to inform that she no longer wants to proceed with the at home sleep study. Pt has not requested a call back. Pt did state that Dr Rexene Alberts was very helpful and she is thankful.

## 2018-08-29 NOTE — Telephone Encounter (Signed)
LVM for patient with Dr. Guadelupe Sabin recommendations. Ask pt to call me back with any questions or is ready to schedule study at a later date.

## 2018-09-19 ENCOUNTER — Ambulatory Visit (INDEPENDENT_AMBULATORY_CARE_PROVIDER_SITE_OTHER): Payer: BC Managed Care – PPO

## 2018-09-19 ENCOUNTER — Telehealth: Payer: Self-pay

## 2018-09-19 ENCOUNTER — Other Ambulatory Visit: Payer: Self-pay

## 2018-09-19 DIAGNOSIS — R0609 Other forms of dyspnea: Secondary | ICD-10-CM

## 2018-09-19 DIAGNOSIS — R0600 Dyspnea, unspecified: Secondary | ICD-10-CM

## 2018-09-19 NOTE — Telephone Encounter (Signed)
Pt called and said that she is having pins and needles in your feet, very hot . She wanted to know if  This can be part of the post viral syndrome you mentioned,   She also wanted to know if she should be on cholesterol medicine .?   Pt also wanted to know if she should still be quarantining

## 2018-09-19 NOTE — Telephone Encounter (Signed)
She is very complex and not sure she needs lipid therapy. I am not sure of her diagnosis with regard to fatigue.  Labs 06/15/2016: Serum glucose 141 mg, BUN 14, creatinine 0.91, CMP normal, potassium 4.6.  Total cholesterol 163, triglycerides 182, HDL 55, LDL 72.  TSH normal, T4 normal. Normal cholesterol. I do not have recent labs

## 2018-09-20 ENCOUNTER — Ambulatory Visit: Payer: BLUE CROSS/BLUE SHIELD | Admitting: Cardiology

## 2018-09-28 ENCOUNTER — Other Ambulatory Visit: Payer: Self-pay | Admitting: Cardiology

## 2018-09-28 NOTE — Telephone Encounter (Signed)
Please fill

## 2018-10-04 ENCOUNTER — Encounter: Payer: Self-pay | Admitting: Cardiology

## 2018-10-08 ENCOUNTER — Ambulatory Visit: Payer: PPO | Admitting: Cardiology

## 2018-10-08 ENCOUNTER — Other Ambulatory Visit: Payer: Self-pay

## 2018-10-08 ENCOUNTER — Encounter: Payer: Self-pay | Admitting: Cardiology

## 2018-10-08 VITALS — BP 134/75 | Ht 64.0 in | Wt 138.0 lb

## 2018-10-08 DIAGNOSIS — I421 Obstructive hypertrophic cardiomyopathy: Secondary | ICD-10-CM

## 2018-10-08 DIAGNOSIS — R5381 Other malaise: Secondary | ICD-10-CM

## 2018-10-08 DIAGNOSIS — R5383 Other fatigue: Secondary | ICD-10-CM | POA: Diagnosis not present

## 2018-10-08 DIAGNOSIS — R0609 Other forms of dyspnea: Secondary | ICD-10-CM

## 2018-10-08 DIAGNOSIS — R0600 Dyspnea, unspecified: Secondary | ICD-10-CM
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
## 2018-10-12 DIAGNOSIS — L710 Perioral dermatitis: Secondary | ICD-10-CM | POA: Diagnosis not present

## 2018-11-14 ENCOUNTER — Telehealth: Payer: Self-pay

## 2018-11-14 NOTE — Telephone Encounter (Signed)
Pt called wanting to know when will it be ok for her to schedule her mammogram appt since you told her to cancel her appt today with her pcp (advised to avoid going in office due to covid). She also wants to know what to do when its time for her vaccines-Tet, flu. Please advise.//ah

## 2018-11-15 NOTE — Telephone Encounter (Signed)
Pt aware.//ah

## 2018-11-15 NOTE — Telephone Encounter (Signed)
She should have the vaccination as usual. Mask, glove, handwash eliminated >95% chance of COVID even if she goes to PCP.  Okay to have mammogram.

## 2018-11-21 ENCOUNTER — Telehealth: Payer: Self-pay

## 2018-11-21 NOTE — Telephone Encounter (Signed)
She really needs to contact her gastroenterologist, it is too complex for me.

## 2018-11-21 NOTE — Telephone Encounter (Signed)
Pt called stating that the ondansetron you prescribed her is not working. Still having a lot of nausea. Please advise.//ah

## 2018-11-26 NOTE — Telephone Encounter (Signed)
Pt aware; SJ

## 2018-11-29 DIAGNOSIS — Z86010 Personal history of colonic polyps: Secondary | ICD-10-CM | POA: Diagnosis not present

## 2018-11-29 DIAGNOSIS — K219 Gastro-esophageal reflux disease without esophagitis: Secondary | ICD-10-CM | POA: Diagnosis not present

## 2018-11-29 DIAGNOSIS — R110 Nausea: Secondary | ICD-10-CM | POA: Diagnosis not present

## 2018-11-30 ENCOUNTER — Telehealth: Payer: Self-pay

## 2018-11-30 NOTE — Telephone Encounter (Signed)
I messaged her directly

## 2018-11-30 NOTE — Telephone Encounter (Signed)
Pt called and asked if you are ok with her GI doctor ordering a swallow test to be done at [facility], please advise?

## 2018-12-03 ENCOUNTER — Other Ambulatory Visit: Payer: Self-pay | Admitting: Gastroenterology

## 2018-12-03 DIAGNOSIS — R202 Paresthesia of skin: Secondary | ICD-10-CM | POA: Diagnosis not present

## 2018-12-03 DIAGNOSIS — F3341 Major depressive disorder, recurrent, in partial remission: Secondary | ICD-10-CM | POA: Diagnosis not present

## 2018-12-03 DIAGNOSIS — F419 Anxiety disorder, unspecified: Secondary | ICD-10-CM | POA: Diagnosis not present

## 2018-12-03 DIAGNOSIS — R110 Nausea: Secondary | ICD-10-CM

## 2018-12-04 ENCOUNTER — Telehealth: Payer: Self-pay | Admitting: Neurology

## 2018-12-04 NOTE — Telephone Encounter (Signed)
Patient called and left a message that she is returning Christy's call.
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
## 2018-12-05 ENCOUNTER — Telehealth (INDEPENDENT_AMBULATORY_CARE_PROVIDER_SITE_OTHER): Payer: PPO | Admitting: Neurology

## 2018-12-05 ENCOUNTER — Other Ambulatory Visit: Payer: Self-pay

## 2018-12-05 ENCOUNTER — Encounter: Payer: Self-pay | Admitting: Neurology

## 2018-12-05 VITALS — Ht 64.0 in | Wt 131.0 lb

## 2018-12-05 DIAGNOSIS — R208 Other disturbances of skin sensation: Secondary | ICD-10-CM | POA: Diagnosis not present

## 2018-12-05 DIAGNOSIS — F13239 Sedative, hypnotic or anxiolytic dependence with withdrawal, unspecified: Secondary | ICD-10-CM | POA: Diagnosis not present

## 2018-12-05 DIAGNOSIS — F13939 Sedative, hypnotic or anxiolytic use, unspecified with withdrawal, unspecified: Secondary | ICD-10-CM

## 2018-12-12 ENCOUNTER — Other Ambulatory Visit: Payer: Self-pay

## 2018-12-12 ENCOUNTER — Encounter (HOSPITAL_COMMUNITY)
Admission: RE | Admit: 2018-12-12 | Discharge: 2018-12-12 | Disposition: A | Discharge status: Home / Self Care | Payer: PPO | Source: Ambulatory Visit | Attending: Gastroenterology | Admitting: Gastroenterology

## 2018-12-12 DIAGNOSIS — R110 Nausea: Secondary | ICD-10-CM | POA: Diagnosis not present

## 2018-12-12 DIAGNOSIS — R6881 Early satiety: Secondary | ICD-10-CM | POA: Diagnosis not present

## 2018-12-12 MED ORDER — TECHNETIUM TC 99M SULFUR COLLOID
2.0000 | Freq: Once | INTRAVENOUS | Status: AC | PRN
  Administered 2018-12-12: 2 via ORAL

## 2019-01-02 ENCOUNTER — Other Ambulatory Visit: Payer: Self-pay | Admitting: Cardiology

## 2019-01-07 ENCOUNTER — Other Ambulatory Visit: Payer: Self-pay

## 2019-01-07 DIAGNOSIS — I421 Obstructive hypertrophic cardiomyopathy: Secondary | ICD-10-CM

## 2019-01-07 MED ORDER — LOSARTAN POTASSIUM 25 MG PO TABS: 25.0000 mg | ORAL_TABLET | Freq: Every day | ORAL | 3 refills | Status: DC

## 2019-01-21 ENCOUNTER — Other Ambulatory Visit: Payer: Self-pay | Admitting: Internal Medicine

## 2019-01-21 DIAGNOSIS — Z1231 Encounter for screening mammogram for malignant neoplasm of breast: Secondary | ICD-10-CM

## 2019-03-08 ENCOUNTER — Ambulatory Visit
Admission: RE | Admit: 2019-03-08 | Discharge: 2019-03-08 | Disposition: A | Discharge status: Home / Self Care | Payer: PPO | Source: Ambulatory Visit | Attending: Internal Medicine | Admitting: Internal Medicine

## 2019-03-08 ENCOUNTER — Other Ambulatory Visit: Payer: Self-pay

## 2019-03-08 DIAGNOSIS — Z1231 Encounter for screening mammogram for malignant neoplasm of breast: Secondary | ICD-10-CM

## 2019-04-24 ENCOUNTER — Ambulatory Visit: Payer: PPO | Attending: Internal Medicine

## 2019-04-24 DIAGNOSIS — Z23 Encounter for immunization: Secondary | ICD-10-CM | POA: Insufficient documentation

## 2019-04-24 NOTE — Progress Notes (Signed)
   Covid-19 Vaccination Clinic  Name:  [name] [name]    MRN: [id number] DOB: 14-Jan-1951  04/24/2019  Ms. South was observed post Covid-19 immunization for 15 minutes without incidence. She was provided with Vaccine Information Sheet and instruction to access the V-Safe system.   [name] was instructed to call 911 with any severe reactions post vaccine: Marland Kitchen Difficulty breathing  . Swelling of your face and throat  . A fast heartbeat  . A bad rash all over your body  . Dizziness and weakness    Immunizations Administered    Name Date Dose VIS Date Route   Pfizer COVID-19 Vaccine 04/24/2019  4:20 PM 0.3 mL 03/15/2019 Intramuscular   Manufacturer: [place]   Lot: [id number]   [place]: [id number]

## 2019-05-12 ENCOUNTER — Ambulatory Visit: Payer: PPO | Attending: Internal Medicine

## 2019-05-12 DIAGNOSIS — Z23 Encounter for immunization: Secondary | ICD-10-CM | POA: Insufficient documentation

## 2019-05-12 NOTE — Progress Notes (Signed)
   Covid-19 Vaccination Clinic  Name:  [name] [name]    MRN: [id number] DOB: November 16, 1950  05/12/2019  [name] was observed post Covid-19 immunization for 15 minutes without incidence. She was provided with Vaccine Information Sheet and instruction to access the V-Safe system.   [name] was instructed to call 911 with any severe reactions post vaccine: Marland Kitchen Difficulty breathing  . Swelling of your face and throat  . A fast heartbeat  . A bad rash all over your body  . Dizziness and weakness    Immunizations Administered    Name Date Dose VIS Date Route   Pfizer COVID-19 Vaccine 05/12/2019  9:02 AM 0.3 mL 03/15/2019 Intramuscular   Manufacturer: [place]   Lot: [id number]   [place]: [id number]

## 2019-05-22 ENCOUNTER — Ambulatory Visit: Payer: PPO

## 2019-06-25 DIAGNOSIS — I421 Obstructive hypertrophic cardiomyopathy: Secondary | ICD-10-CM | POA: Diagnosis not present

## 2019-06-25 DIAGNOSIS — F3341 Major depressive disorder, recurrent, in partial remission: Secondary | ICD-10-CM | POA: Diagnosis not present

## 2019-06-25 DIAGNOSIS — I10 Essential (primary) hypertension: Secondary | ICD-10-CM | POA: Diagnosis not present

## 2019-06-25 DIAGNOSIS — R21 Rash and other nonspecific skin eruption: Secondary | ICD-10-CM | POA: Diagnosis not present

## 2019-06-25 DIAGNOSIS — R110 Nausea: Secondary | ICD-10-CM | POA: Diagnosis not present

## 2019-07-10 ENCOUNTER — Telehealth: Payer: Self-pay | Admitting: Neurology

## 2019-07-10 NOTE — Telephone Encounter (Signed)
Patient called requesting a follow up appointment with Dr. Posey Pronto. She is still experiencing symptoms.  Routing to provider for advice on scheduling.

## 2019-07-10 NOTE — Telephone Encounter (Signed)
Patient scheduled 07/22/19 at 11:30 AM.

## 2019-07-10 NOTE — Telephone Encounter (Signed)
OK to offer appt on 4/19.
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
## 2019-07-18 ENCOUNTER — Other Ambulatory Visit: Payer: Self-pay

## 2019-07-18 ENCOUNTER — Encounter: Payer: Self-pay | Admitting: Cardiology

## 2019-07-18 ENCOUNTER — Ambulatory Visit: Payer: PPO | Admitting: Cardiology

## 2019-07-18 VITALS — BP 144/61 | HR 96 | Temp 97.2°F | Resp 16 | Ht 64.0 in | Wt 136.6 lb

## 2019-07-18 DIAGNOSIS — M546 Pain in thoracic spine: Secondary | ICD-10-CM

## 2019-07-18 DIAGNOSIS — I421 Obstructive hypertrophic cardiomyopathy: Secondary | ICD-10-CM | POA: Diagnosis not present

## 2019-07-18 DIAGNOSIS — R0602 Shortness of breath: Secondary | ICD-10-CM

## 2019-07-18 DIAGNOSIS — J45990 Exercise induced bronchospasm: Secondary | ICD-10-CM | POA: Diagnosis not present

## 2019-07-18 DIAGNOSIS — I10 Essential (primary) hypertension: Secondary | ICD-10-CM | POA: Diagnosis not present

## 2019-07-18 DIAGNOSIS — R0600 Dyspnea, unspecified: Secondary | ICD-10-CM

## 2019-07-18 DIAGNOSIS — R0609 Other forms of dyspnea: Secondary | ICD-10-CM

## 2019-07-18 MED ORDER — ALBUTEROL SULFATE HFA 108 (90 BASE) MCG/ACT IN AERS: 2.0000 | INHALATION_SPRAY | Freq: Four times a day (QID) | RESPIRATORY_TRACT | 2 refills | Status: DC | PRN

## 2019-07-18 MED ORDER — NEBIVOLOL HCL 5 MG PO TABS: 5.0000 mg | ORAL_TABLET | Freq: Every day | ORAL | 0 refills | Status: DC
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
## 2019-07-19 ENCOUNTER — Ambulatory Visit: Payer: PPO

## 2019-07-19 ENCOUNTER — Other Ambulatory Visit: Payer: Self-pay

## 2019-07-19 DIAGNOSIS — R0600 Dyspnea, unspecified: Secondary | ICD-10-CM

## 2019-07-19 DIAGNOSIS — I421 Obstructive hypertrophic cardiomyopathy: Secondary | ICD-10-CM

## 2019-07-19 DIAGNOSIS — R0609 Other forms of dyspnea: Secondary | ICD-10-CM | POA: Diagnosis not present

## 2019-07-22 ENCOUNTER — Ambulatory Visit: Payer: PPO | Admitting: Neurology

## 2019-07-22 ENCOUNTER — Other Ambulatory Visit: Payer: Self-pay

## 2019-07-22 ENCOUNTER — Encounter: Payer: Self-pay | Admitting: Neurology

## 2019-07-22 VITALS — BP 121/73 | HR 63 | Resp 20 | Ht 64.0 in | Wt 139.0 lb

## 2019-07-22 DIAGNOSIS — R202 Paresthesia of skin: Secondary | ICD-10-CM | POA: Diagnosis not present

## 2019-07-22 DIAGNOSIS — M5416 Radiculopathy, lumbar region: Secondary | ICD-10-CM | POA: Diagnosis not present

## 2019-07-22 NOTE — Patient Instructions (Addendum)
Start out-patient physical therapy for low back strengthening and stretching  Call with an update in 6-8 weeks.  If no improvement, then the next step is MRI lumbar spine
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
## 2019-07-24 DIAGNOSIS — M545 Low back pain: Secondary | ICD-10-CM | POA: Diagnosis not present

## 2019-07-24 DIAGNOSIS — M79672 Pain in left foot: Secondary | ICD-10-CM | POA: Diagnosis not present

## 2019-07-24 DIAGNOSIS — R208 Other disturbances of skin sensation: Secondary | ICD-10-CM | POA: Diagnosis not present

## 2019-07-24 DIAGNOSIS — M79671 Pain in right foot: Secondary | ICD-10-CM | POA: Diagnosis not present

## 2019-07-29 ENCOUNTER — Telehealth: Payer: Self-pay

## 2019-07-29 DIAGNOSIS — M545 Low back pain: Secondary | ICD-10-CM | POA: Diagnosis not present

## 2019-07-29 DIAGNOSIS — M79672 Pain in left foot: Secondary | ICD-10-CM | POA: Diagnosis not present

## 2019-07-29 DIAGNOSIS — R208 Other disturbances of skin sensation: Secondary | ICD-10-CM | POA: Diagnosis not present

## 2019-07-29 DIAGNOSIS — M79671 Pain in right foot: Secondary | ICD-10-CM | POA: Diagnosis not present

## 2019-07-29 NOTE — Telephone Encounter (Signed)
Pt called and asked about changes you saw on her ekg as well as echo results

## 2019-07-29 NOTE — Telephone Encounter (Signed)
Very non specific EKG changes and very subtle. No change in echo and LVH is stable

## 2019-08-01 ENCOUNTER — Other Ambulatory Visit: Payer: Self-pay

## 2019-08-01 DIAGNOSIS — I421 Obstructive hypertrophic cardiomyopathy: Secondary | ICD-10-CM

## 2019-08-01 DIAGNOSIS — I10 Essential (primary) hypertension: Secondary | ICD-10-CM

## 2019-08-01 MED ORDER — NEBIVOLOL HCL 5 MG PO TABS: 5.0000 mg | ORAL_TABLET | Freq: Every day | ORAL | 0 refills | Status: DC

## 2019-08-05 DIAGNOSIS — M79672 Pain in left foot: Secondary | ICD-10-CM | POA: Diagnosis not present

## 2019-08-05 DIAGNOSIS — M545 Low back pain: Secondary | ICD-10-CM | POA: Diagnosis not present

## 2019-08-05 DIAGNOSIS — M79671 Pain in right foot: Secondary | ICD-10-CM | POA: Diagnosis not present

## 2019-08-05 DIAGNOSIS — R208 Other disturbances of skin sensation: Secondary | ICD-10-CM | POA: Diagnosis not present

## 2019-08-12 DIAGNOSIS — R208 Other disturbances of skin sensation: Secondary | ICD-10-CM | POA: Diagnosis not present

## 2019-08-12 DIAGNOSIS — M79672 Pain in left foot: Secondary | ICD-10-CM | POA: Diagnosis not present

## 2019-08-12 DIAGNOSIS — M545 Low back pain: Secondary | ICD-10-CM | POA: Diagnosis not present

## 2019-08-12 DIAGNOSIS — M79671 Pain in right foot: Secondary | ICD-10-CM | POA: Diagnosis not present

## 2019-09-23 ENCOUNTER — Other Ambulatory Visit: Payer: Self-pay | Admitting: Cardiology

## 2019-09-23 DIAGNOSIS — I10 Essential (primary) hypertension: Secondary | ICD-10-CM

## 2019-09-23 DIAGNOSIS — I421 Obstructive hypertrophic cardiomyopathy: Secondary | ICD-10-CM

## 2019-10-09 ENCOUNTER — Ambulatory Visit: Payer: PPO | Admitting: Cardiology

## 2019-10-24 ENCOUNTER — Encounter: Payer: Self-pay | Admitting: Cardiology

## 2019-10-24 ENCOUNTER — Other Ambulatory Visit: Payer: Self-pay

## 2019-10-24 ENCOUNTER — Ambulatory Visit: Payer: PPO | Admitting: Cardiology

## 2019-10-24 VITALS — BP 114/43 | HR 62 | Resp 16 | Ht 64.0 in | Wt 141.4 lb

## 2019-10-24 DIAGNOSIS — I421 Obstructive hypertrophic cardiomyopathy: Secondary | ICD-10-CM | POA: Diagnosis not present

## 2019-10-24 DIAGNOSIS — I10 Essential (primary) hypertension: Secondary | ICD-10-CM

## 2019-10-24 DIAGNOSIS — R0609 Other forms of dyspnea: Secondary | ICD-10-CM

## 2019-10-24 DIAGNOSIS — R0600 Dyspnea, unspecified: Secondary | ICD-10-CM
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
## 2019-12-24 ENCOUNTER — Other Ambulatory Visit: Payer: Self-pay | Admitting: Cardiology

## 2019-12-24 DIAGNOSIS — I10 Essential (primary) hypertension: Secondary | ICD-10-CM

## 2019-12-24 DIAGNOSIS — I421 Obstructive hypertrophic cardiomyopathy: Secondary | ICD-10-CM

## 2019-12-26 DIAGNOSIS — H2513 Age-related nuclear cataract, bilateral: Secondary | ICD-10-CM | POA: Diagnosis not present

## 2019-12-26 DIAGNOSIS — H40033 Anatomical narrow angle, bilateral: Secondary | ICD-10-CM | POA: Diagnosis not present

## 2019-12-30 ENCOUNTER — Other Ambulatory Visit: Payer: Self-pay | Admitting: Cardiology

## 2019-12-30 DIAGNOSIS — H40033 Anatomical narrow angle, bilateral: Secondary | ICD-10-CM | POA: Diagnosis not present

## 2019-12-30 DIAGNOSIS — I421 Obstructive hypertrophic cardiomyopathy: Secondary | ICD-10-CM

## 2020-02-03 DIAGNOSIS — H40033 Anatomical narrow angle, bilateral: Secondary | ICD-10-CM | POA: Diagnosis not present

## 2020-02-03 DIAGNOSIS — H2513 Age-related nuclear cataract, bilateral: Secondary | ICD-10-CM | POA: Diagnosis not present

## 2020-03-17 DIAGNOSIS — H02834 Dermatochalasis of left upper eyelid: Secondary | ICD-10-CM | POA: Diagnosis not present

## 2020-03-17 DIAGNOSIS — H43393 Other vitreous opacities, bilateral: Secondary | ICD-10-CM | POA: Diagnosis not present

## 2020-03-17 DIAGNOSIS — H40033 Anatomical narrow angle, bilateral: Secondary | ICD-10-CM | POA: Diagnosis not present

## 2020-03-17 DIAGNOSIS — H02831 Dermatochalasis of right upper eyelid: Secondary | ICD-10-CM | POA: Diagnosis not present

## 2020-03-17 DIAGNOSIS — H2513 Age-related nuclear cataract, bilateral: Secondary | ICD-10-CM | POA: Diagnosis not present

## 2020-03-19 ENCOUNTER — Other Ambulatory Visit: Payer: Self-pay | Admitting: Cardiology

## 2020-03-19 DIAGNOSIS — I421 Obstructive hypertrophic cardiomyopathy: Secondary | ICD-10-CM

## 2020-03-30 ENCOUNTER — Other Ambulatory Visit: Payer: Self-pay | Admitting: Cardiology

## 2020-03-30 DIAGNOSIS — I10 Essential (primary) hypertension: Secondary | ICD-10-CM

## 2020-03-30 DIAGNOSIS — I421 Obstructive hypertrophic cardiomyopathy: Secondary | ICD-10-CM

## 2020-04-21 ENCOUNTER — Ambulatory Visit: Payer: PPO | Admitting: Cardiology

## 2020-04-23 ENCOUNTER — Other Ambulatory Visit: Payer: Self-pay

## 2020-04-23 ENCOUNTER — Encounter: Payer: Self-pay | Admitting: Cardiology

## 2020-04-23 ENCOUNTER — Ambulatory Visit: Payer: PPO | Admitting: Cardiology

## 2020-04-23 VITALS — BP 113/51 | HR 63 | Temp 97.6°F | Resp 16 | Ht 64.0 in | Wt 151.0 lb

## 2020-04-23 DIAGNOSIS — I421 Obstructive hypertrophic cardiomyopathy: Secondary | ICD-10-CM

## 2020-04-23 DIAGNOSIS — I10 Essential (primary) hypertension: Secondary | ICD-10-CM

## 2020-04-23 DIAGNOSIS — R0600 Dyspnea, unspecified: Secondary | ICD-10-CM

## 2020-04-23 DIAGNOSIS — R0609 Other forms of dyspnea: Secondary | ICD-10-CM | POA: Diagnosis not present
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
## 2020-04-23 NOTE — Progress Notes (Signed)
You are seeing her.

## 2020-04-27 ENCOUNTER — Ambulatory Visit: Payer: PPO | Admitting: Cardiology

## 2020-04-29 ENCOUNTER — Ambulatory Visit: Payer: PPO

## 2020-04-29 ENCOUNTER — Other Ambulatory Visit: Payer: Self-pay

## 2020-04-29 DIAGNOSIS — I421 Obstructive hypertrophic cardiomyopathy: Secondary | ICD-10-CM | POA: Diagnosis not present

## 2020-06-25 ENCOUNTER — Other Ambulatory Visit: Payer: Self-pay | Admitting: Cardiology

## 2020-06-25 DIAGNOSIS — I421 Obstructive hypertrophic cardiomyopathy: Secondary | ICD-10-CM

## 2020-07-01 IMAGING — CT CT ABD-PELV W/ CM
1 of 3 series · 13 of 32 positions shown, 19 images · IV contrast (APPLIED)
Comparison: None.

CLINICAL DATA: Chronic abdominal pain, nausea and vomiting,
diarrhea, and fatigue.

EXAM:
CT ABDOMEN AND PELVIS WITH CONTRAST
TECHNIQUE: Multidetector CT imaging of the abdomen and pelvis was performed
using the standard protocol following bolus administration of
intravenous contrast.
CONTRAST:  100mL [id number] IOPAMIDOL ([id number]) INJECTION 61%

[Series 2: abd/pelvis w/cm · axial · 0.75mm/px · z∈[-225,+205]mm · 13 of 100 slices shown, 19 images]
[im 7/100  soft-tissue]
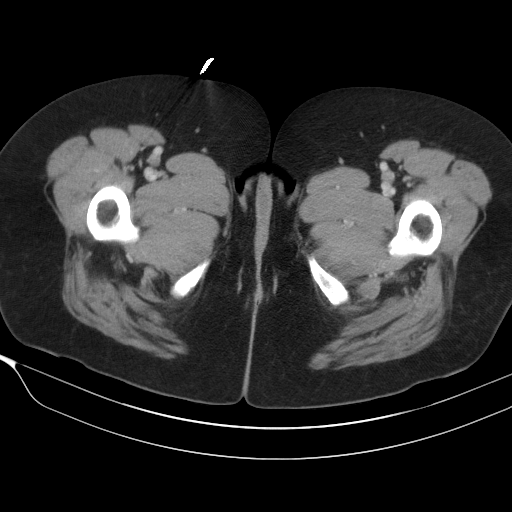
[im 7/100  bone]
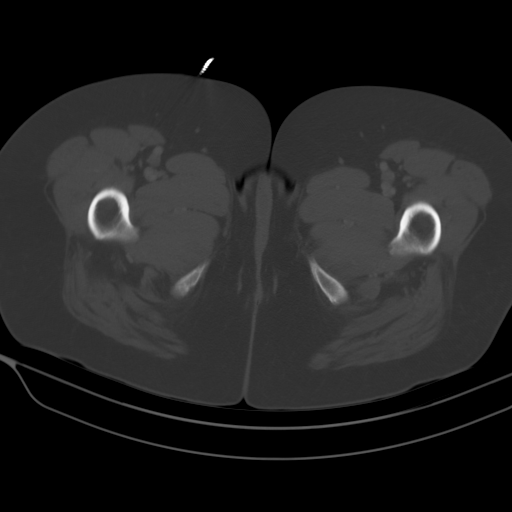
[im 14/100  soft-tissue]
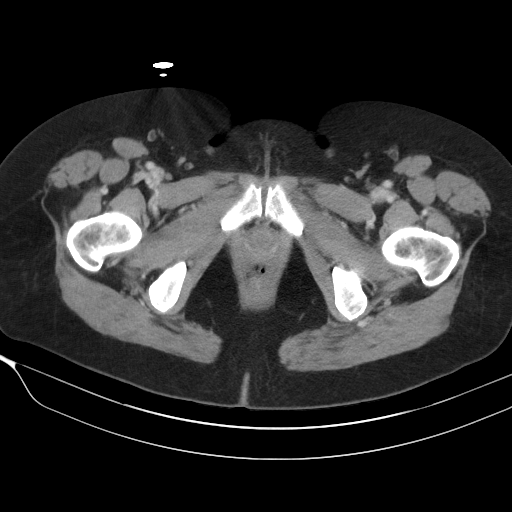
[im 20/100  soft-tissue]
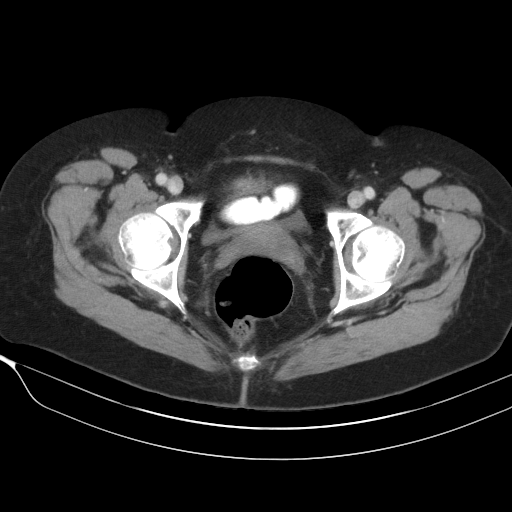
[im 27/100  soft-tissue]
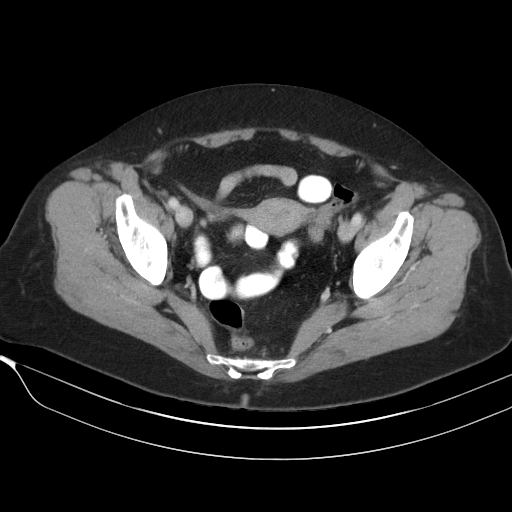
[im 34/100  soft-tissue]
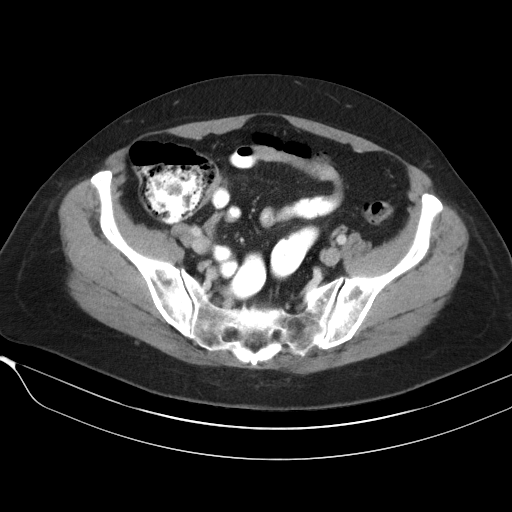
[im 40/100  soft-tissue]
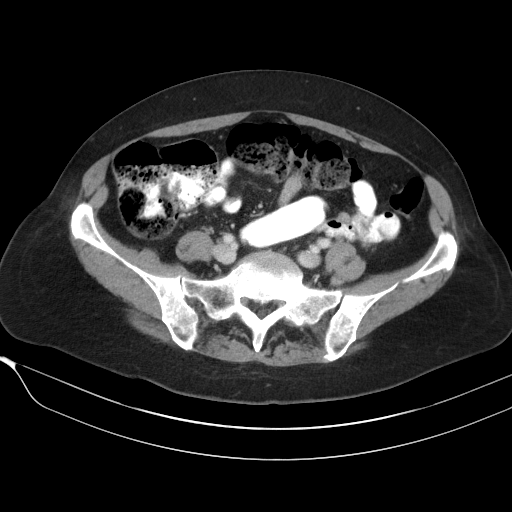
[im 53/100  soft-tissue]
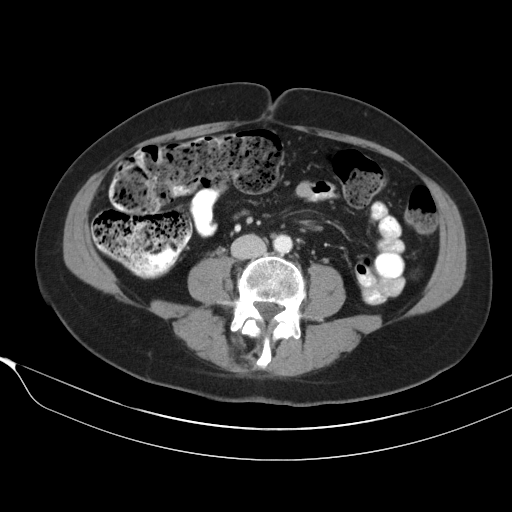
[im 60/100  soft-tissue]
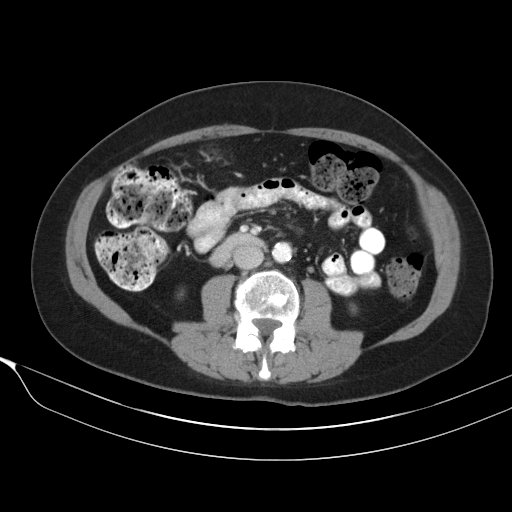
[im 67/100  soft-tissue]
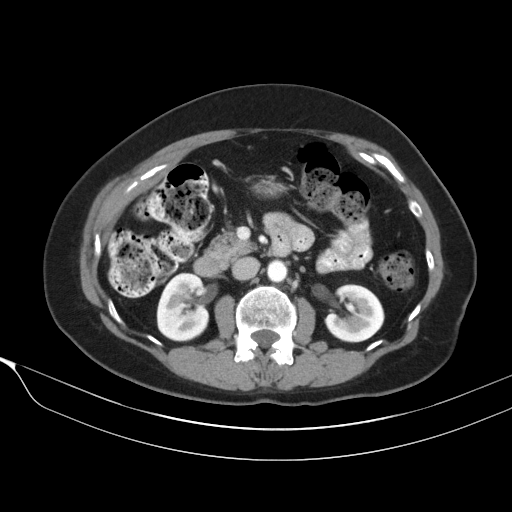
[im 67/100  bone]
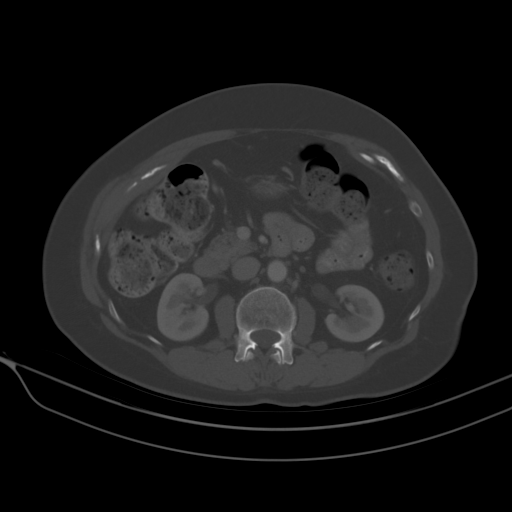
[im 73/100  soft-tissue]
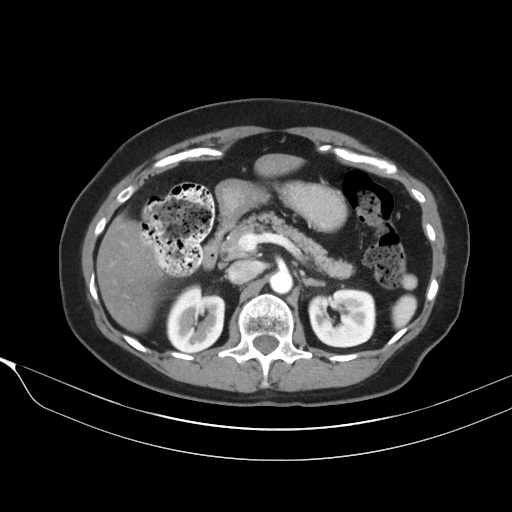
[im 73/100  lung]
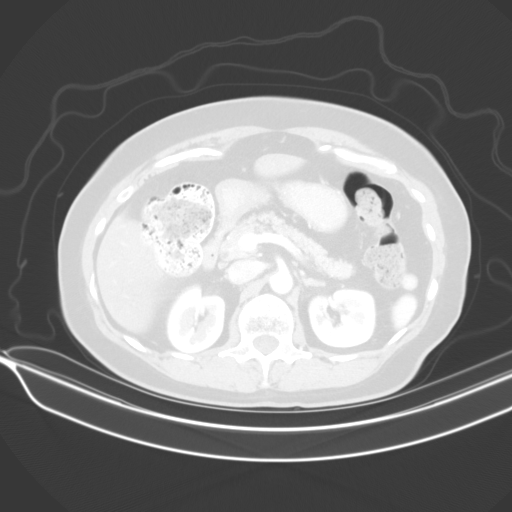
[im 80/100  soft-tissue]
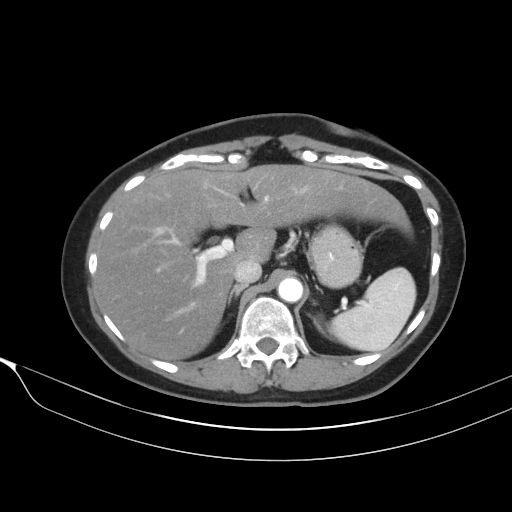
[im 80/100  lung]
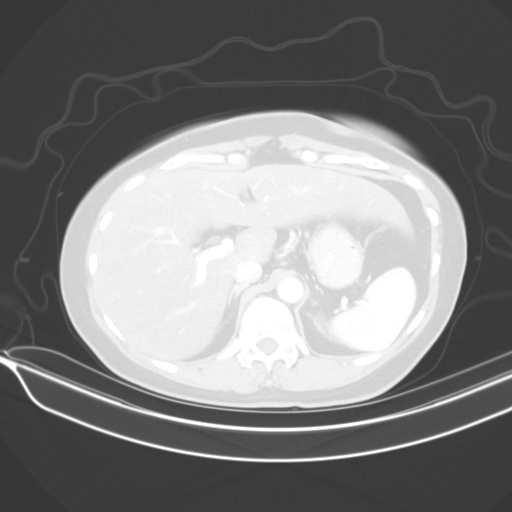
[im 86/100  soft-tissue]
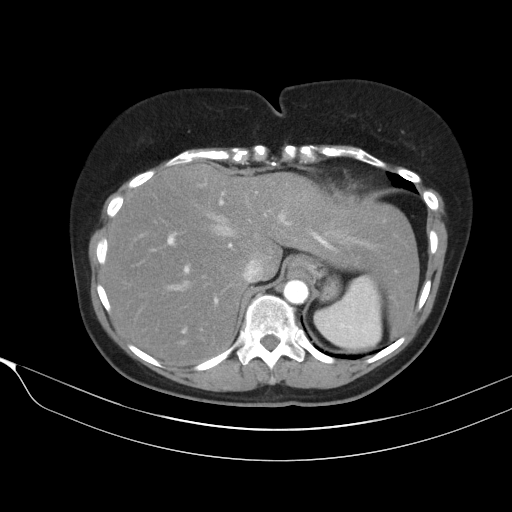
[im 86/100  lung]
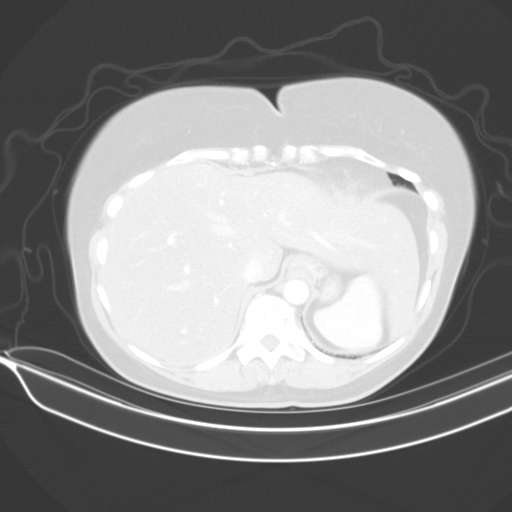
[im 93/100  soft-tissue]
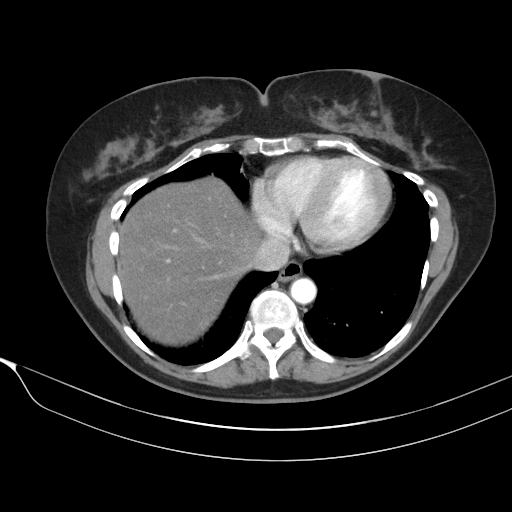
[im 93/100  lung]
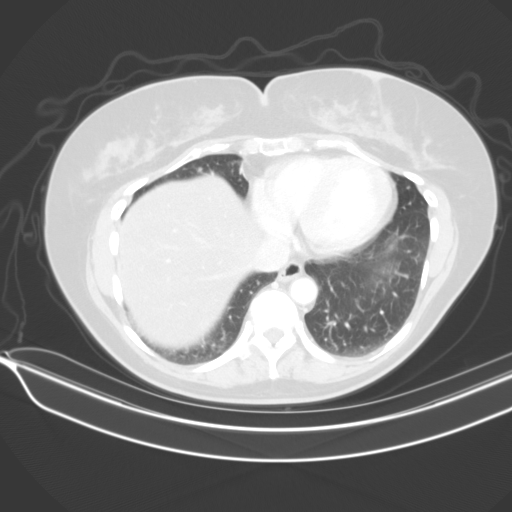

[13 of 32 positions shown; findings below may reference images not displayed]

FINDINGS: Lower Chest: No acute findings.

Hepatobiliary: No hepatic masses identified. Moderate to severe
hepatic steatosis. Gallbladder is unremarkable. No evidence of
biliary ductal dilatation.

Pancreas:  No mass or inflammatory changes.

Spleen: Within normal limits in size and appearance.

Adrenals/Urinary Tract: No masses identified. No evidence of
ureteral calculi or hydronephrosis. Urinary bladder is empty.

Stomach/Bowel: No evidence of obstruction, inflammatory process or
abnormal fluid collections. Large stool burden seen particularly in
the ascending and transverse colon.

Vascular/Lymphatic: No pathologically enlarged lymph nodes. No
abdominal aortic aneurysm. Aortic atherosclerosis.

Reproductive: Small uterine fibroid in left fundal region measuring
approximately 1.7 cm. Adnexa are unremarkable.

Other:  None.

Musculoskeletal:  No suspicious bone lesions identified.
IMPRESSION: No acute findings.

Moderate to severe hepatic steatosis.

Small uterine fibroid.

Large stool burden noted; recommend clinical correlation for
possible constipation.

## 2020-07-13 DIAGNOSIS — I10 Essential (primary) hypertension: Secondary | ICD-10-CM | POA: Diagnosis not present

## 2020-07-13 DIAGNOSIS — F3341 Major depressive disorder, recurrent, in partial remission: Secondary | ICD-10-CM | POA: Diagnosis not present

## 2020-07-17 ENCOUNTER — Ambulatory Visit: Payer: PPO | Admitting: Cardiology

## 2020-08-07 ENCOUNTER — Other Ambulatory Visit (HOSPITAL_BASED_OUTPATIENT_CLINIC_OR_DEPARTMENT_OTHER): Payer: Self-pay

## 2020-08-07 ENCOUNTER — Ambulatory Visit: Payer: PPO

## 2020-08-07 ENCOUNTER — Telehealth (HOSPITAL_BASED_OUTPATIENT_CLINIC_OR_DEPARTMENT_OTHER): Payer: Self-pay | Admitting: Internal Medicine

## 2020-08-07 NOTE — Telephone Encounter (Signed)
Pt called [facility] - PCP to cancel her Covid Booster - I called the [facility] and relayed Pt message to [facility] -

## 2020-08-10 ENCOUNTER — Ambulatory Visit: Payer: PPO | Attending: Internal Medicine

## 2020-08-10 ENCOUNTER — Other Ambulatory Visit (HOSPITAL_BASED_OUTPATIENT_CLINIC_OR_DEPARTMENT_OTHER): Payer: Self-pay

## 2020-08-10 ENCOUNTER — Other Ambulatory Visit: Payer: Self-pay

## 2020-08-10 DIAGNOSIS — Z23 Encounter for immunization: Secondary | ICD-10-CM

## 2020-08-10 MED ORDER — PFIZER-BIONT COVID-19 VAC-TRIS 30 MCG/0.3ML IM SUSP
INTRAMUSCULAR | 0 refills | Status: DC
  Filled 2020-08-10: qty 0.3, 1d supply, fill #0

## 2020-08-10 NOTE — Progress Notes (Signed)
   Covid-19 Vaccination Clinic  Name:  [name] [name]    MRN: [id number] DOB: 08-31-1950  08/10/2020  [name] was observed post Covid-19 immunization for 15 minutes without incident. She was provided with Vaccine Information Sheet and instruction to access the V-Safe system.   [name] was instructed to call [phone] with any severe reactions post vaccine: Marland Kitchen Difficulty breathing  . Swelling of face and throat  . A fast heartbeat  . A bad rash all over body  . Dizziness and weakness   Immunizations Administered    Name Date Dose VIS Date Route   PFIZER Comrnaty(Gray TOP) Covid-19 Vaccine 08/10/2020  2:32 PM 0.3 mL 03/12/2020 Intramuscular   Manufacturer: [place]   Lot: [id number]   NDC: [phone]
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
## 2020-08-18 ENCOUNTER — Other Ambulatory Visit: Payer: Self-pay

## 2020-08-18 ENCOUNTER — Encounter: Payer: Self-pay | Admitting: Student

## 2020-08-18 ENCOUNTER — Ambulatory Visit: Payer: PPO | Admitting: Student

## 2020-08-18 VITALS — BP 124/58 | HR 67 | Temp 98.2°F | Ht 64.0 in | Wt 153.0 lb

## 2020-08-18 DIAGNOSIS — R079 Chest pain, unspecified: Secondary | ICD-10-CM | POA: Diagnosis not present

## 2020-08-18 DIAGNOSIS — I421 Obstructive hypertrophic cardiomyopathy: Secondary | ICD-10-CM | POA: Diagnosis not present

## 2020-08-18 DIAGNOSIS — I10 Essential (primary) hypertension: Secondary | ICD-10-CM | POA: Diagnosis not present

## 2020-09-14 DIAGNOSIS — I10 Essential (primary) hypertension: Secondary | ICD-10-CM | POA: Diagnosis not present

## 2020-09-14 DIAGNOSIS — F3341 Major depressive disorder, recurrent, in partial remission: Secondary | ICD-10-CM | POA: Diagnosis not present

## 2020-09-14 DIAGNOSIS — Z1231 Encounter for screening mammogram for malignant neoplasm of breast: Secondary | ICD-10-CM | POA: Diagnosis not present

## 2020-09-14 DIAGNOSIS — E2839 Other primary ovarian failure: Secondary | ICD-10-CM | POA: Diagnosis not present

## 2020-09-25 DIAGNOSIS — R350 Frequency of micturition: Secondary | ICD-10-CM | POA: Diagnosis not present

## 2020-09-29 ENCOUNTER — Other Ambulatory Visit: Payer: Self-pay | Admitting: Cardiology

## 2020-09-29 DIAGNOSIS — I10 Essential (primary) hypertension: Secondary | ICD-10-CM

## 2020-09-29 DIAGNOSIS — I421 Obstructive hypertrophic cardiomyopathy: Secondary | ICD-10-CM

## 2020-12-01 DIAGNOSIS — F3341 Major depressive disorder, recurrent, in partial remission: Secondary | ICD-10-CM | POA: Diagnosis not present

## 2020-12-01 DIAGNOSIS — Z0000 Encounter for general adult medical examination without abnormal findings: Secondary | ICD-10-CM | POA: Diagnosis not present

## 2020-12-01 DIAGNOSIS — R739 Hyperglycemia, unspecified: Secondary | ICD-10-CM | POA: Diagnosis not present

## 2020-12-01 DIAGNOSIS — I10 Essential (primary) hypertension: Secondary | ICD-10-CM | POA: Diagnosis not present

## 2020-12-01 DIAGNOSIS — Z1231 Encounter for screening mammogram for malignant neoplasm of breast: Secondary | ICD-10-CM | POA: Diagnosis not present

## 2020-12-01 DIAGNOSIS — Z7189 Other specified counseling: Secondary | ICD-10-CM | POA: Diagnosis not present

## 2020-12-01 DIAGNOSIS — I421 Obstructive hypertrophic cardiomyopathy: Secondary | ICD-10-CM | POA: Diagnosis not present

## 2020-12-21 DIAGNOSIS — S90222A Contusion of left lesser toe(s) with damage to nail, initial encounter: Secondary | ICD-10-CM | POA: Diagnosis not present

## 2020-12-22 ENCOUNTER — Telehealth: Payer: Self-pay

## 2021-01-07 ENCOUNTER — Ambulatory Visit (INDEPENDENT_AMBULATORY_CARE_PROVIDER_SITE_OTHER): Payer: PPO | Admitting: Psychology

## 2021-01-07 DIAGNOSIS — F331 Major depressive disorder, recurrent, moderate: Secondary | ICD-10-CM

## 2021-01-21 ENCOUNTER — Ambulatory Visit (INDEPENDENT_AMBULATORY_CARE_PROVIDER_SITE_OTHER): Payer: PPO | Admitting: Psychology

## 2021-01-21 ENCOUNTER — Other Ambulatory Visit: Payer: Self-pay

## 2021-01-21 DIAGNOSIS — F331 Major depressive disorder, recurrent, moderate: Secondary | ICD-10-CM

## 2021-02-04 ENCOUNTER — Ambulatory Visit (INDEPENDENT_AMBULATORY_CARE_PROVIDER_SITE_OTHER): Payer: PPO | Admitting: Psychology

## 2021-02-04 ENCOUNTER — Other Ambulatory Visit: Payer: Self-pay

## 2021-02-04 DIAGNOSIS — F331 Major depressive disorder, recurrent, moderate: Secondary | ICD-10-CM

## 2021-02-11 ENCOUNTER — Ambulatory Visit: Payer: PPO | Admitting: Psychology

## 2021-02-18 ENCOUNTER — Ambulatory Visit: Payer: PPO | Admitting: Psychology

## 2021-03-04 ENCOUNTER — Other Ambulatory Visit: Payer: Self-pay

## 2021-03-04 ENCOUNTER — Ambulatory Visit (INDEPENDENT_AMBULATORY_CARE_PROVIDER_SITE_OTHER): Payer: PPO | Admitting: Psychology

## 2021-03-04 DIAGNOSIS — F331 Major depressive disorder, recurrent, moderate: Secondary | ICD-10-CM | POA: Diagnosis not present

## 2021-03-18 ENCOUNTER — Ambulatory Visit: Payer: PPO | Admitting: Psychology

## 2021-03-30 ENCOUNTER — Other Ambulatory Visit: Payer: Self-pay | Admitting: Cardiology

## 2021-03-30 DIAGNOSIS — I10 Essential (primary) hypertension: Secondary | ICD-10-CM

## 2021-03-30 DIAGNOSIS — I421 Obstructive hypertrophic cardiomyopathy: Secondary | ICD-10-CM

## 2021-04-01 ENCOUNTER — Ambulatory Visit: Payer: PPO | Admitting: Psychology

## 2021-04-15 ENCOUNTER — Ambulatory Visit (INDEPENDENT_AMBULATORY_CARE_PROVIDER_SITE_OTHER): Payer: PPO | Admitting: Psychology

## 2021-04-15 ENCOUNTER — Other Ambulatory Visit: Payer: Self-pay

## 2021-04-15 DIAGNOSIS — F331 Major depressive disorder, recurrent, moderate: Secondary | ICD-10-CM

## 2021-04-15 NOTE — Progress Notes (Signed)
Progress Note Start: 04/15/2021 01:00 PM End: 04/15/2020 01:55 PM  Diagnosis F41.1 (Generalized anxiety disorder) [n/a]  Symptoms Decrease or loss of appetite. (Status: maintained) -- No Description Entered  Depressed or irritable mood. (Status: maintained) -- No Description Entered  Diminished interest in or enjoyment of activities. (Status: maintained) -- No Description Entered  Feelings of hopelessness, worthlessness, or inappropriate guilt. (Status: maintained) -- No Description Entered  History of chronic or recurrent depression for which the client has taken antidepressant medication, been hospitalized, had outpatient treatment, or had a course of electroconvulsive therapy. (Status: maintained) -- No Description Entered  Lack of energy. (Status: maintained) -- No Description Entered  Serial losses in life (i.e., deaths, divorces, jobs) that led to depression and discouragement. (Status: maintained) -- No Description Entered  Sleeplessness or hypersomnia. (Status: maintained) -- No Description Entered  Medication Status compliance  Safety none  If Suicidal or Homicidal State Action Taken: unspecified  Current Risk: low Medications unspecified Objectives Related Problem: Appropriately grieve the loss in order to normalize mood and to return to previously adaptive level of functioning. Description: Learn and implement relapse prevention skills. Target Date: 2022-01-04 Frequency: Biweekly Modality: individual Progress: 0%  Related Problem: Appropriately grieve the loss in order to normalize mood and to return to previously adaptive level of functioning. Description: Learn and implement problem-solving and decision-making skills. Target Date: 2022-01-04 Frequency: Biweekly Modality: individual Progress: 0%  Related Problem: Appropriately grieve the loss in order to normalize mood and to return to previously adaptive level of functioning. Description: Identify and replace  thoughts and beliefs that support depression. Target Date: 2022-01-04 Frequency: Biweekly Modality: individual Progress: 0%  Related Problem: Appropriately grieve the loss in order to normalize mood and to return to previously adaptive level of functioning. Description: Learn and implement behavioral strategies to overcome depression. Target Date: 2022-01-04 Frequency: Biweekly Modality: individual Progress: 0%  Related Problem: Appropriately grieve the loss in order to normalize mood and to return to previously adaptive level of functioning. Description: Learn and implement conflict resolution skills to resolve interpersonal problems. Target Date: 2022-01-04 Frequency: Biweekly Modality: individual Progress: 0%  Related Problem: Appropriately grieve the loss in order to normalize mood and to return to previously adaptive level of functioning. Description: Increasingly verbalize hopeful and positive statements regarding self, others, and the future. Target Date: 2022-01-04 Frequency: Biweekly Modality: individual Progress: 0%  Related Problem: Appropriately grieve the loss in order to normalize mood and to return to previously adaptive level of functioning. Description: Implement mindfulness techniques for relapse prevention. Target Date: 2022-01-04 Frequency: Biweekly Modality: individual Progress: 0%  Related Problem: Complete the process of letting go of the lost significant other. Description: Identify and voice positives about the deceased loved one including previous positive experiences, positive characteristics, positive aspects of the relationship, and how these things may be remembered. Target Date: 2022-01-04 Frequency: Biweekly Modality: individual Progress: 0%  Related Problem: Complete the process of letting go of the lost significant other. Description: Tell in detail the story of the current loss that is triggering symptoms. Target Date:  2022-01-04 Frequency: Biweekly Modality: individual Progress: 0% Planned Intervention: Create a safe environment for disclosure and actively build the level of trust with the client in individual sessions through consistent eye contact, active listening, unconditional positive regard, and warm acceptance to help increase his/her ability to identify and express thoughts and feelings.  Planned Intervention: Use empathy, compassion, and support, allowing the client to tell in detail the story of his/her recent loss.  Related Problem: Complete  the process of letting go of the lost significant other. Description: Begin verbalizing feelings associated with the loss. Target Date: 2022-01-04 Frequency: Biweekly Modality: individual Progress: 0%  Related Problem: Complete the process of letting go of the lost significant other. Description: Verbalize and resolve feelings of anger or guilt focused on self or deceased loved one that interfere with the grieving process. Target Date: 2022-01-04 Frequency: Biweekly Modality: individual Progress: 0%  Related Problem: Learn and implement coping skills that result in a reduction of anxiety and worry, and improved daily functioning. Description: Cooperate with a medication evaluation by a physician. Target Date: 2022-01-04 Frequency: Biweekly Modality: individual Progress: 0%  Related Problem: Learn and implement coping skills that result in a reduction of anxiety and worry, and improved daily functioning. Description: Identify and engage in pleasant activities on a daily basis. Target Date: 2022-01-04 Frequency: Biweekly Modality: individual Progress: 0%  Related Problem: Learn and implement coping skills that result in a reduction of anxiety and worry, and improved daily functioning. Description: Identify, challenge, and replace biased, fearful self-talk with positive, realistic, and empowering self-talk. Target Date: 2022-01-04 Frequency:  Biweekly Modality: individual Progress: 0%  Related Problem: Learn and implement coping skills that result in a reduction of anxiety and worry, and improved daily functioning. Description: Verbalize an understanding of the role that cognitive biases play in excessive irrational worry and persistent anxiety symptoms. Target Date: 2022-01-04 Frequency: Biweekly Modality: individual Progress: 0%  Related Problem: Learn and implement coping skills that result in a reduction of anxiety and worry, and improved daily functioning. Description: Learn and implement calming skills to reduce overall anxiety and manage anxiety symptoms. Target Date: 2022-01-04 Frequency: Biweekly Modality: individual Progress: 0%  Client Response full compliance  Service Location Location, [address] [place],  [address]  Service Code cpt [phone]  Self care activities  Lifestyle change (exercise, nutrition)  Identify/label emotions  Validate/empathize  Supportive PSychotherapy  Grief and Loss Work  Comments  I met with [name] [name] in the office for in person individual psychotherapy.   [name] [name] reports that she and her husband traveled to [place] to be for her nephew's funeral. She shared that the family dynamics were tough to deal with the entire time. We d/e/p her feelings of grief.    [name] [name] admits that all she wants to do right now is sleep. She states that she and had another heart incident where she feels like a "lightening stroke" in her chest and had difficulties breathing.  [name] [name] has an appointment with her cardiologist next week.    Progress: none since intake               Royetta Crochet, PhD

## 2021-04-15 NOTE — Progress Notes (Signed)
Progress Note Start: 04/15/2021 01:00 PM End: 04/15/2020 01:58 PM  Diagnosis F41.1 (Generalized anxiety disorder) [n/a]  Symptoms Decrease or loss of appetite. (Status: maintained) -- No Description Entered  Depressed or irritable mood. (Status: maintained) -- No Description Entered  Diminished interest in or enjoyment of activities. (Status: maintained) -- No Description Entered  Feelings of hopelessness, worthlessness, or inappropriate guilt. (Status: maintained) -- No Description Entered  History of chronic or recurrent depression for which the client has taken antidepressant medication, been hospitalized, had outpatient treatment, or had a course of electroconvulsive therapy. (Status: maintained) -- No Description Entered  Lack of energy. (Status: maintained) -- No Description Entered  Serial losses in life (i.e., deaths, divorces, jobs) that led to depression and discouragement. (Status: maintained) -- No Description Entered  Sleeplessness or hypersomnia. (Status: maintained) -- No Description Entered  Medication Status compliance  Safety none  If Suicidal or Homicidal State Action Taken: unspecified  Current Risk: low Medications unspecified Objectives Related Problem: Appropriately grieve the loss in order to normalize mood and to return to previously adaptive level of functioning. Description: Learn and implement relapse prevention skills. Target Date: 2022-01-04 Frequency: Biweekly Modality: individual Progress: 0%  Related Problem: Appropriately grieve the loss in order to normalize mood and to return to previously adaptive level of functioning. Description: Learn and implement problem-solving and decision-making skills. Target Date: 2022-01-04 Frequency: Biweekly Modality: individual Progress: 0%  Related Problem: Appropriately grieve the loss in order to normalize mood and to return to previously adaptive level of functioning. Description: Identify and replace  thoughts and beliefs that support depression. Target Date: 2022-01-04 Frequency: Biweekly Modality: individual Progress: 0%  Related Problem: Appropriately grieve the loss in order to normalize mood and to return to previously adaptive level of functioning. Description: Learn and implement behavioral strategies to overcome depression. Target Date: 2022-01-04 Frequency: Biweekly Modality: individual Progress: 0%  Related Problem: Appropriately grieve the loss in order to normalize mood and to return to previously adaptive level of functioning. Description: Learn and implement conflict resolution skills to resolve interpersonal problems. Target Date: 2022-01-04 Frequency: Biweekly Modality: individual Progress: 0%  Related Problem: Appropriately grieve the loss in order to normalize mood and to return to previously adaptive level of functioning. Description: Increasingly verbalize hopeful and positive statements regarding self, others, and the future. Target Date: 2022-01-04 Frequency: Biweekly Modality: individual Progress: 0%  Related Problem: Appropriately grieve the loss in order to normalize mood and to return to previously adaptive level of functioning. Description: Implement mindfulness techniques for relapse prevention. Target Date: 2022-01-04 Frequency: Biweekly Modality: individual Progress: 0%  Related Problem: Complete the process of letting go of the lost significant other. Description: Identify and voice positives about the deceased loved one including previous positive experiences, positive characteristics, positive aspects of the relationship, and how these things may be remembered. Target Date: 2022-01-04 Frequency: Biweekly Modality: individual Progress: 0%  Related Problem: Complete the process of letting go of the lost significant other. Description: Tell in detail the story of the current loss that is triggering symptoms. Target Date:  2022-01-04 Frequency: Biweekly Modality: individual Progress: 0% Planned Intervention: Create a safe environment for disclosure and actively build the level of trust with the client in individual sessions through consistent eye contact, active listening, unconditional positive regard, and warm acceptance to help increase his/her ability to identify and express thoughts and feelings.  Planned Intervention: Use empathy, compassion, and support, allowing the client to tell in detail the story of his/her recent loss.  Related Problem: Complete  the process of letting go of the lost significant other. Description: Begin verbalizing feelings associated with the loss. Target Date: 2022-01-04 Frequency: Biweekly Modality: individual Progress: 0%  Related Problem: Complete the process of letting go of the lost significant other. Description: Verbalize and resolve feelings of anger or guilt focused on self or deceased loved one that interfere with the grieving process. Target Date: 2022-01-04 Frequency: Biweekly Modality: individual Progress: 0%  Related Problem: Learn and implement coping skills that result in a reduction of anxiety and worry, and improved daily functioning. Description: Cooperate with a medication evaluation by a physician. Target Date: 2022-01-04 Frequency: Biweekly Modality: individual Progress: 0%  Related Problem: Learn and implement coping skills that result in a reduction of anxiety and worry, and improved daily functioning. Description: Identify and engage in pleasant activities on a daily basis. Target Date: 2022-01-04 Frequency: Biweekly Modality: individual Progress: 0%  Related Problem: Learn and implement coping skills that result in a reduction of anxiety and worry, and improved daily functioning. Description: Identify, challenge, and replace biased, fearful self-talk with positive, realistic, and empowering self-talk. Target Date: 2022-01-04 Frequency:  Biweekly Modality: individual Progress: 0%  Related Problem: Learn and implement coping skills that result in a reduction of anxiety and worry, and improved daily functioning. Description: Verbalize an understanding of the role that cognitive biases play in excessive irrational worry and persistent anxiety symptoms. Target Date: 2022-01-04 Frequency: Biweekly Modality: individual Progress: 0%  Related Problem: Learn and implement coping skills that result in a reduction of anxiety and worry, and improved daily functioning. Description: Learn and implement calming skills to reduce overall anxiety and manage anxiety symptoms. Target Date: 2022-01-04 Frequency: Biweekly Modality: individual Progress: 0%  Client Response full compliance  Service Location Location, [address] [place], [place] [address]  Service Code cpt [phone]  Self care activities  Lifestyle change (exercise, nutrition)  Identify/label emotions  Validate/empathize  Supportive PSychotherapy  Grief and Loss Work  Comments  I met with [name] [name] in the office for in person individual psychotherapy.   [name] [name] reports that she and her husband traveled to [place] to be for her nephew's funeral. She shared that the family dynamics were tough to deal with the entire time.  We d/e/p her feelings of grief over her nephew's death.  [name] [name] also shared that  her older brother is dying of cancer.  She has been estranged from him for many years.  She dose however have a positive relationship with his two children and we talked about how she can focus her attend and support on them.     [name] [name] admits that all she wants to do right now is sleep.  She states that she and had another heart incident where she feels like a "lightening stroke" in her chest and had difficulties breathing.  [name] [name] has an appointment with her cardiologist next week.  Moodwise her moods states are up and down.  We talked about grief and its effect on mood and  body.     Progress: Pt demonstrates varying levels of depression aeb fluctuating experience of dysphoria, fatigue, sleep disruption, negative ruminations and difficulties being able so envision the future.          [name] Crochet, PhD

## 2021-04-16 ENCOUNTER — Ambulatory Visit: Payer: PPO | Admitting: Cardiology

## 2021-04-16 ENCOUNTER — Encounter: Payer: Self-pay | Admitting: Cardiology

## 2021-04-16 VITALS — BP 120/61 | HR 61 | Temp 97.8°F | Resp 17 | Ht 64.0 in | Wt 160.2 lb

## 2021-04-16 DIAGNOSIS — R0609 Other forms of dyspnea: Secondary | ICD-10-CM | POA: Diagnosis not present

## 2021-04-16 DIAGNOSIS — I10 Essential (primary) hypertension: Secondary | ICD-10-CM | POA: Diagnosis not present

## 2021-04-16 DIAGNOSIS — I421 Obstructive hypertrophic cardiomyopathy: Secondary | ICD-10-CM
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
## 2021-04-20 ENCOUNTER — Other Ambulatory Visit: Payer: PPO

## 2021-04-26 ENCOUNTER — Ambulatory Visit: Payer: PPO | Admitting: Cardiology

## 2021-04-27 ENCOUNTER — Ambulatory Visit: Payer: PPO

## 2021-04-27 ENCOUNTER — Other Ambulatory Visit: Payer: Self-pay

## 2021-04-27 DIAGNOSIS — R0609 Other forms of dyspnea: Secondary | ICD-10-CM | POA: Diagnosis not present

## 2021-04-27 DIAGNOSIS — I421 Obstructive hypertrophic cardiomyopathy: Secondary | ICD-10-CM

## 2021-04-29 ENCOUNTER — Ambulatory Visit: Payer: PPO | Admitting: Psychology

## 2021-05-13 ENCOUNTER — Ambulatory Visit: Payer: PPO | Admitting: Psychology

## 2021-05-14 ENCOUNTER — Other Ambulatory Visit: Payer: Self-pay | Admitting: Cardiology

## 2021-05-14 ENCOUNTER — Encounter: Payer: Self-pay | Admitting: Cardiology

## 2021-05-14 DIAGNOSIS — I421 Obstructive hypertrophic cardiomyopathy: Secondary | ICD-10-CM

## 2021-05-17 ENCOUNTER — Ambulatory Visit: Payer: PPO | Admitting: Cardiology

## 2021-05-19 DIAGNOSIS — I421 Obstructive hypertrophic cardiomyopathy: Secondary | ICD-10-CM | POA: Diagnosis not present

## 2021-05-19 LAB — CBC
Hematocrit: 43.8 % (ref 34.0–46.6)
Hemoglobin: 14.6 g/dL (ref 11.1–15.9)
MCH: 29.9 pg (ref 26.6–33.0)
MCHC: 33.3 g/dL (ref 31.5–35.7)
MCV: 90 fL (ref 79–97)
Platelets: 269 10*3/uL (ref 150–450)
RBC: 4.88 x10E6/uL (ref 3.77–5.28)
RDW: 12.4 % (ref 11.7–15.4)
WBC: 8.1 10*3/uL (ref 3.4–10.8)

## 2021-05-24 ENCOUNTER — Telehealth (HOSPITAL_COMMUNITY): Payer: Self-pay | Admitting: *Deleted

## 2021-05-24 NOTE — Telephone Encounter (Signed)
Attempted to call patient regarding upcoming cardiac MRI appointment. Left message on voicemail with name and callback number  Allister Lessley RN Navigator Cardiac Imaging [facility] [phone] Office [phone] Cell  

## 2021-05-25 ENCOUNTER — Other Ambulatory Visit: Payer: Self-pay

## 2021-05-25 ENCOUNTER — Ambulatory Visit (HOSPITAL_COMMUNITY)
Admission: RE | Admit: 2021-05-25 | Discharge: 2021-05-25 | Disposition: A | Discharge status: Home / Self Care | Payer: PPO | Source: Ambulatory Visit | Attending: Cardiology | Admitting: Cardiology

## 2021-05-25 DIAGNOSIS — R0609 Other forms of dyspnea: Secondary | ICD-10-CM | POA: Insufficient documentation

## 2021-05-25 DIAGNOSIS — I421 Obstructive hypertrophic cardiomyopathy: Secondary | ICD-10-CM | POA: Diagnosis not present

## 2021-05-25 MED ORDER — GADOBUTROL 1 MMOL/ML IV SOLN
10.0000 mL | Freq: Once | INTRAVENOUS | Status: AC | PRN
  Administered 2021-05-25: 10 mL via INTRAVENOUS

## 2021-05-27 ENCOUNTER — Ambulatory Visit: Payer: PPO | Admitting: Psychology

## 2021-05-27 NOTE — Progress Notes (Signed)
Cardiac MRI 05/27/2021: 1. Asymmetric LV hypertrophy measuring 92mm in basal septum (45mm in posterior wall), not meeting criteria for hypertrophic cardiomyopathy (less than 35mm) 2.  No late gadolinium enhancement to suggest myocardial scar 3.  Normal LV size with hyperdynamic systolic function (EF 18%) 4.  Normal RV size and systolic function (EF 56%)

## 2021-06-04 ENCOUNTER — Ambulatory Visit: Payer: PPO | Admitting: Cardiology

## 2021-06-04 ENCOUNTER — Encounter: Payer: Self-pay | Admitting: Cardiology

## 2021-06-04 ENCOUNTER — Other Ambulatory Visit: Payer: Self-pay

## 2021-06-04 VITALS — BP 136/73 | HR 72 | Temp 97.6°F | Resp 16 | Ht 64.0 in | Wt 160.8 lb

## 2021-06-04 DIAGNOSIS — I10 Essential (primary) hypertension: Secondary | ICD-10-CM

## 2021-06-04 DIAGNOSIS — I119 Hypertensive heart disease without heart failure: Secondary | ICD-10-CM | POA: Diagnosis not present

## 2021-06-04 DIAGNOSIS — R0609 Other forms of dyspnea: Secondary | ICD-10-CM | POA: Diagnosis not present
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
## 2021-06-07 ENCOUNTER — Ambulatory Visit: Payer: PPO | Admitting: Cardiology

## 2021-06-10 ENCOUNTER — Ambulatory Visit: Payer: PPO | Admitting: Psychology

## 2021-06-24 ENCOUNTER — Ambulatory Visit: Payer: PPO | Admitting: Psychology

## 2021-06-25 DIAGNOSIS — N3020 Other chronic cystitis without hematuria: Secondary | ICD-10-CM | POA: Diagnosis not present

## 2021-06-25 DIAGNOSIS — N952 Postmenopausal atrophic vaginitis: Secondary | ICD-10-CM | POA: Diagnosis not present

## 2021-06-25 DIAGNOSIS — N3945 Continuous leakage: Secondary | ICD-10-CM | POA: Diagnosis not present

## 2021-07-07 ENCOUNTER — Other Ambulatory Visit: Payer: Self-pay

## 2021-07-07 DIAGNOSIS — I10 Essential (primary) hypertension: Secondary | ICD-10-CM

## 2021-07-07 DIAGNOSIS — I421 Obstructive hypertrophic cardiomyopathy: Secondary | ICD-10-CM

## 2021-07-07 MED ORDER — BYSTOLIC 5 MG PO TABS: 5.0000 mg | ORAL_TABLET | Freq: Every day | ORAL | 1 refills | Status: DC

## 2021-07-08 ENCOUNTER — Other Ambulatory Visit: Payer: Self-pay

## 2021-07-12 DIAGNOSIS — M6281 Muscle weakness (generalized): Secondary | ICD-10-CM | POA: Diagnosis not present

## 2021-07-12 DIAGNOSIS — M62838 Other muscle spasm: Secondary | ICD-10-CM | POA: Diagnosis not present

## 2021-07-12 DIAGNOSIS — N3020 Other chronic cystitis without hematuria: Secondary | ICD-10-CM | POA: Diagnosis not present

## 2021-07-12 DIAGNOSIS — M6289 Other specified disorders of muscle: Secondary | ICD-10-CM | POA: Diagnosis not present

## 2021-07-13 DIAGNOSIS — R0602 Shortness of breath: Secondary | ICD-10-CM | POA: Diagnosis not present

## 2021-07-13 DIAGNOSIS — F3341 Major depressive disorder, recurrent, in partial remission: Secondary | ICD-10-CM | POA: Diagnosis not present

## 2021-07-13 DIAGNOSIS — I10 Essential (primary) hypertension: Secondary | ICD-10-CM | POA: Diagnosis not present

## 2021-07-14 ENCOUNTER — Ambulatory Visit
Admission: RE | Admit: 2021-07-14 | Discharge: 2021-07-14 | Disposition: A | Discharge status: Home / Self Care | Payer: PPO | Source: Ambulatory Visit | Attending: Internal Medicine | Admitting: Internal Medicine

## 2021-07-14 ENCOUNTER — Other Ambulatory Visit: Payer: Self-pay | Admitting: Internal Medicine

## 2021-07-14 DIAGNOSIS — R0602 Shortness of breath: Secondary | ICD-10-CM

## 2021-07-21 DIAGNOSIS — M6281 Muscle weakness (generalized): Secondary | ICD-10-CM | POA: Diagnosis not present

## 2021-07-21 DIAGNOSIS — N3945 Continuous leakage: Secondary | ICD-10-CM | POA: Diagnosis not present

## 2021-07-21 DIAGNOSIS — M6289 Other specified disorders of muscle: Secondary | ICD-10-CM | POA: Diagnosis not present

## 2021-07-21 DIAGNOSIS — M62838 Other muscle spasm: Secondary | ICD-10-CM | POA: Diagnosis not present

## 2021-07-28 ENCOUNTER — Other Ambulatory Visit: Payer: Self-pay | Admitting: Cardiology

## 2021-07-28 DIAGNOSIS — R0609 Other forms of dyspnea: Secondary | ICD-10-CM

## 2021-07-28 DIAGNOSIS — N3942 Incontinence without sensory awareness: Secondary | ICD-10-CM | POA: Diagnosis not present

## 2021-07-28 DIAGNOSIS — I119 Hypertensive heart disease without heart failure: Secondary | ICD-10-CM

## 2021-07-28 DIAGNOSIS — M6289 Other specified disorders of muscle: Secondary | ICD-10-CM | POA: Diagnosis not present

## 2021-07-28 DIAGNOSIS — M6281 Muscle weakness (generalized): Secondary | ICD-10-CM | POA: Diagnosis not present

## 2021-07-28 DIAGNOSIS — M62838 Other muscle spasm: Secondary | ICD-10-CM | POA: Diagnosis not present

## 2021-07-28 NOTE — Progress Notes (Signed)
ICD-10-CM   ?1. Dyspnea on exertion  R06.09 Pulmonary rehab therapeutic exercise  ?  ?2. Hypertensive left ventricular hypertrophy, without heart failure  I11.9 Pulmonary rehab therapeutic exercise  ?  ? ?Orders Placed This Encounter  ?Procedures  ? Pulmonary rehab therapeutic exercise  ?  Standing Status:   Future  ?  Standing Expiration Date:   07/29/2022  ? ? ?Adrian Prows, MD, [facility] ?07/28/2021, 11:42 AM ?Office: [phone] ?Fax: [phone] ?Pager: [phone]  ?

## 2021-08-11 DIAGNOSIS — M62838 Other muscle spasm: Secondary | ICD-10-CM | POA: Diagnosis not present

## 2021-08-11 DIAGNOSIS — M6281 Muscle weakness (generalized): Secondary | ICD-10-CM | POA: Diagnosis not present

## 2021-08-11 DIAGNOSIS — M6289 Other specified disorders of muscle: Secondary | ICD-10-CM | POA: Diagnosis not present

## 2021-08-11 DIAGNOSIS — N3942 Incontinence without sensory awareness: Secondary | ICD-10-CM | POA: Diagnosis not present

## 2021-09-21 ENCOUNTER — Other Ambulatory Visit: Payer: Self-pay | Admitting: Internal Medicine

## 2021-09-21 DIAGNOSIS — Z1231 Encounter for screening mammogram for malignant neoplasm of breast: Secondary | ICD-10-CM

## 2021-09-22 DIAGNOSIS — M62838 Other muscle spasm: Secondary | ICD-10-CM | POA: Diagnosis not present

## 2021-09-22 DIAGNOSIS — M6289 Other specified disorders of muscle: Secondary | ICD-10-CM | POA: Diagnosis not present

## 2021-09-22 DIAGNOSIS — M6281 Muscle weakness (generalized): Secondary | ICD-10-CM | POA: Diagnosis not present

## 2021-09-22 DIAGNOSIS — N3942 Incontinence without sensory awareness: Secondary | ICD-10-CM | POA: Diagnosis not present

## 2021-09-23 ENCOUNTER — Ambulatory Visit: Payer: PPO

## 2021-09-24 ENCOUNTER — Ambulatory Visit
Admission: RE | Admit: 2021-09-24 | Discharge: 2021-09-24 | Disposition: A | Discharge status: Home / Self Care | Payer: PPO | Source: Ambulatory Visit | Attending: Internal Medicine | Admitting: Internal Medicine

## 2021-09-24 DIAGNOSIS — Z1231 Encounter for screening mammogram for malignant neoplasm of breast: Secondary | ICD-10-CM

## 2021-10-01 ENCOUNTER — Other Ambulatory Visit: Payer: Self-pay | Admitting: Cardiology

## 2021-10-01 DIAGNOSIS — I421 Obstructive hypertrophic cardiomyopathy: Secondary | ICD-10-CM

## 2021-10-21 DIAGNOSIS — M6281 Muscle weakness (generalized): Secondary | ICD-10-CM | POA: Diagnosis not present

## 2021-10-21 DIAGNOSIS — M62838 Other muscle spasm: Secondary | ICD-10-CM | POA: Diagnosis not present

## 2021-10-21 DIAGNOSIS — N3945 Continuous leakage: Secondary | ICD-10-CM | POA: Diagnosis not present

## 2021-10-21 DIAGNOSIS — N3942 Incontinence without sensory awareness: Secondary | ICD-10-CM | POA: Diagnosis not present

## 2021-10-25 DIAGNOSIS — M6281 Muscle weakness (generalized): Secondary | ICD-10-CM | POA: Diagnosis not present

## 2021-10-25 DIAGNOSIS — N3945 Continuous leakage: Secondary | ICD-10-CM | POA: Diagnosis not present

## 2021-10-25 DIAGNOSIS — M62838 Other muscle spasm: Secondary | ICD-10-CM | POA: Diagnosis not present

## 2021-10-25 DIAGNOSIS — N3942 Incontinence without sensory awareness: Secondary | ICD-10-CM | POA: Diagnosis not present

## 2021-10-27 DIAGNOSIS — N952 Postmenopausal atrophic vaginitis: Secondary | ICD-10-CM | POA: Diagnosis not present

## 2021-10-27 DIAGNOSIS — N3946 Mixed incontinence: Secondary | ICD-10-CM | POA: Diagnosis not present

## 2021-10-27 DIAGNOSIS — N3020 Other chronic cystitis without hematuria: Secondary | ICD-10-CM | POA: Diagnosis not present

## 2021-11-01 DIAGNOSIS — L821 Other seborrheic keratosis: Secondary | ICD-10-CM | POA: Diagnosis not present

## 2021-11-01 DIAGNOSIS — D1801 Hemangioma of skin and subcutaneous tissue: Secondary | ICD-10-CM | POA: Diagnosis not present

## 2021-11-01 DIAGNOSIS — R202 Paresthesia of skin: Secondary | ICD-10-CM | POA: Diagnosis not present

## 2022-01-03 ENCOUNTER — Other Ambulatory Visit: Payer: Self-pay | Admitting: Cardiology

## 2022-01-03 DIAGNOSIS — I421 Obstructive hypertrophic cardiomyopathy: Secondary | ICD-10-CM

## 2022-01-03 DIAGNOSIS — I10 Essential (primary) hypertension: Secondary | ICD-10-CM

## 2022-02-16 DIAGNOSIS — N952 Postmenopausal atrophic vaginitis: Secondary | ICD-10-CM | POA: Diagnosis not present

## 2022-02-16 DIAGNOSIS — N3020 Other chronic cystitis without hematuria: Secondary | ICD-10-CM | POA: Diagnosis not present

## 2022-02-16 DIAGNOSIS — N3946 Mixed incontinence: Secondary | ICD-10-CM | POA: Diagnosis not present

## 2022-02-21 DIAGNOSIS — F3341 Major depressive disorder, recurrent, in partial remission: Secondary | ICD-10-CM | POA: Diagnosis not present

## 2022-02-21 DIAGNOSIS — I10 Essential (primary) hypertension: Secondary | ICD-10-CM | POA: Diagnosis not present

## 2022-02-21 DIAGNOSIS — Z0000 Encounter for general adult medical examination without abnormal findings: Secondary | ICD-10-CM | POA: Diagnosis not present

## 2022-02-21 DIAGNOSIS — Z1159 Encounter for screening for other viral diseases: Secondary | ICD-10-CM | POA: Diagnosis not present

## 2022-02-21 DIAGNOSIS — R202 Paresthesia of skin: Secondary | ICD-10-CM | POA: Diagnosis not present

## 2022-02-21 DIAGNOSIS — R32 Unspecified urinary incontinence: Secondary | ICD-10-CM | POA: Diagnosis not present

## 2022-02-21 DIAGNOSIS — Z1331 Encounter for screening for depression: Secondary | ICD-10-CM | POA: Diagnosis not present

## 2022-02-21 DIAGNOSIS — E559 Vitamin D deficiency, unspecified: Secondary | ICD-10-CM | POA: Diagnosis not present

## 2022-04-01 ENCOUNTER — Other Ambulatory Visit: Payer: Self-pay | Admitting: Cardiology

## 2022-04-01 DIAGNOSIS — I421 Obstructive hypertrophic cardiomyopathy: Secondary | ICD-10-CM

## 2022-05-22 DIAGNOSIS — H43393 Other vitreous opacities, bilateral: Secondary | ICD-10-CM | POA: Diagnosis not present

## 2022-05-30 DIAGNOSIS — N952 Postmenopausal atrophic vaginitis: Secondary | ICD-10-CM | POA: Diagnosis not present

## 2022-05-30 DIAGNOSIS — F3341 Major depressive disorder, recurrent, in partial remission: Secondary | ICD-10-CM | POA: Diagnosis not present

## 2022-05-30 DIAGNOSIS — I10 Essential (primary) hypertension: Secondary | ICD-10-CM | POA: Diagnosis not present

## 2022-05-30 DIAGNOSIS — I519 Heart disease, unspecified: Secondary | ICD-10-CM | POA: Diagnosis not present

## 2022-05-30 DIAGNOSIS — I421 Obstructive hypertrophic cardiomyopathy: Secondary | ICD-10-CM | POA: Diagnosis not present

## 2022-05-30 DIAGNOSIS — N3020 Other chronic cystitis without hematuria: Secondary | ICD-10-CM | POA: Diagnosis not present

## 2022-06-06 ENCOUNTER — Ambulatory Visit: Payer: PPO | Admitting: Cardiology

## 2022-06-14 ENCOUNTER — Encounter: Payer: Self-pay | Admitting: Cardiology

## 2022-06-14 ENCOUNTER — Ambulatory Visit: Payer: PPO | Admitting: Cardiology

## 2022-06-14 VITALS — BP 116/50 | HR 67 | Resp 16 | Ht 64.0 in | Wt 162.0 lb

## 2022-06-14 DIAGNOSIS — E785 Hyperlipidemia, unspecified: Secondary | ICD-10-CM

## 2022-06-14 DIAGNOSIS — R0609 Other forms of dyspnea: Secondary | ICD-10-CM

## 2022-06-14 DIAGNOSIS — I119 Hypertensive heart disease without heart failure: Secondary | ICD-10-CM | POA: Diagnosis not present

## 2022-06-14 DIAGNOSIS — I10 Essential (primary) hypertension: Secondary | ICD-10-CM

## 2022-06-14 MED ORDER — NEBIVOLOL HCL 5 MG PO TABS: 2.5000 mg | ORAL_TABLET | Freq: Every day | ORAL | 1 refills | Status: DC
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
## 2022-06-17 ENCOUNTER — Ambulatory Visit: Payer: PPO

## 2022-06-17 DIAGNOSIS — R0609 Other forms of dyspnea: Secondary | ICD-10-CM

## 2022-06-17 NOTE — Progress Notes (Signed)
Exercise treadmill stress test 06/17/2022: Exercise treadmill stress test performed using Bruce protocol for 3 minutes and 45 seconds.  Patient reached 5.5 METS, and 82% of age predicted maximum heart rate.  Exercise capacity was low. Submaximal heart rate response due to low exercise capacity. The baseline blood pressure was 140/70 mmHg and increased to 240/50 mmHg at peak exercise. Stress EKG at 82% MPHR revealed no ischemic changes. Inconclusive study due to submaximal effort. Recommend clinical correlation.

## 2022-07-03 DIAGNOSIS — F332 Major depressive disorder, recurrent severe without psychotic features: Secondary | ICD-10-CM | POA: Diagnosis not present

## 2022-07-22 ENCOUNTER — Other Ambulatory Visit: Payer: Self-pay | Admitting: Cardiology

## 2022-07-22 DIAGNOSIS — F419 Anxiety disorder, unspecified: Secondary | ICD-10-CM | POA: Diagnosis not present

## 2022-07-22 DIAGNOSIS — I10 Essential (primary) hypertension: Secondary | ICD-10-CM | POA: Diagnosis not present

## 2022-07-22 DIAGNOSIS — R102 Pelvic and perineal pain: Secondary | ICD-10-CM | POA: Diagnosis not present

## 2022-07-22 DIAGNOSIS — N3946 Mixed incontinence: Secondary | ICD-10-CM | POA: Diagnosis not present

## 2022-07-22 DIAGNOSIS — F332 Major depressive disorder, recurrent severe without psychotic features: Secondary | ICD-10-CM | POA: Diagnosis not present

## 2022-07-22 DIAGNOSIS — N952 Postmenopausal atrophic vaginitis: Secondary | ICD-10-CM | POA: Diagnosis not present

## 2022-07-22 DIAGNOSIS — I421 Obstructive hypertrophic cardiomyopathy: Secondary | ICD-10-CM | POA: Diagnosis not present

## 2022-07-25 ENCOUNTER — Other Ambulatory Visit: Payer: Self-pay | Admitting: Cardiology

## 2022-07-25 DIAGNOSIS — I10 Essential (primary) hypertension: Secondary | ICD-10-CM

## 2022-08-02 DIAGNOSIS — H2513 Age-related nuclear cataract, bilateral: Secondary | ICD-10-CM | POA: Diagnosis not present

## 2022-08-02 DIAGNOSIS — H18413 Arcus senilis, bilateral: Secondary | ICD-10-CM | POA: Diagnosis not present

## 2022-08-02 DIAGNOSIS — H25013 Cortical age-related cataract, bilateral: Secondary | ICD-10-CM | POA: Diagnosis not present

## 2022-08-02 DIAGNOSIS — F332 Major depressive disorder, recurrent severe without psychotic features: Secondary | ICD-10-CM | POA: Diagnosis not present

## 2022-08-02 DIAGNOSIS — H35372 Puckering of macula, left eye: Secondary | ICD-10-CM | POA: Diagnosis not present

## 2022-08-19 DIAGNOSIS — L249 Irritant contact dermatitis, unspecified cause: Secondary | ICD-10-CM | POA: Diagnosis not present

## 2022-09-02 DIAGNOSIS — F332 Major depressive disorder, recurrent severe without psychotic features: Secondary | ICD-10-CM | POA: Diagnosis not present

## 2022-09-21 ENCOUNTER — Other Ambulatory Visit: Payer: Self-pay | Admitting: Cardiology

## 2022-09-21 DIAGNOSIS — I421 Obstructive hypertrophic cardiomyopathy: Secondary | ICD-10-CM

## 2022-10-24 DIAGNOSIS — L821 Other seborrheic keratosis: Secondary | ICD-10-CM | POA: Diagnosis not present

## 2022-10-24 DIAGNOSIS — L820 Inflamed seborrheic keratosis: Secondary | ICD-10-CM | POA: Diagnosis not present

## 2022-11-16 NOTE — Telephone Encounter (Signed)
No action done

## 2022-12-29 ENCOUNTER — Other Ambulatory Visit: Payer: Self-pay | Admitting: Internal Medicine

## 2022-12-29 DIAGNOSIS — Z1231 Encounter for screening mammogram for malignant neoplasm of breast: Secondary | ICD-10-CM

## 2023-01-19 ENCOUNTER — Ambulatory Visit
Admission: RE | Admit: 2023-01-19 | Discharge: 2023-01-19 | Disposition: A | Discharge status: Home / Self Care | Payer: PPO | Source: Ambulatory Visit | Attending: Internal Medicine | Admitting: Internal Medicine

## 2023-01-19 DIAGNOSIS — Z1231 Encounter for screening mammogram for malignant neoplasm of breast: Secondary | ICD-10-CM

## 2023-02-21 DIAGNOSIS — M25562 Pain in left knee: Secondary | ICD-10-CM | POA: Diagnosis not present

## 2023-03-01 DIAGNOSIS — E785 Hyperlipidemia, unspecified: Secondary | ICD-10-CM | POA: Diagnosis not present

## 2023-03-01 DIAGNOSIS — I421 Obstructive hypertrophic cardiomyopathy: Secondary | ICD-10-CM | POA: Diagnosis not present

## 2023-03-01 DIAGNOSIS — Z0000 Encounter for general adult medical examination without abnormal findings: Secondary | ICD-10-CM | POA: Diagnosis not present

## 2023-03-01 DIAGNOSIS — F3341 Major depressive disorder, recurrent, in partial remission: Secondary | ICD-10-CM | POA: Diagnosis not present

## 2023-03-01 DIAGNOSIS — E2839 Other primary ovarian failure: Secondary | ICD-10-CM | POA: Diagnosis not present

## 2023-03-01 DIAGNOSIS — W19XXXA Unspecified fall, initial encounter: Secondary | ICD-10-CM | POA: Diagnosis not present

## 2023-03-01 DIAGNOSIS — R32 Unspecified urinary incontinence: Secondary | ICD-10-CM | POA: Diagnosis not present

## 2023-03-01 DIAGNOSIS — S0990XA Unspecified injury of head, initial encounter: Secondary | ICD-10-CM | POA: Diagnosis not present

## 2023-03-10 DIAGNOSIS — Z23 Encounter for immunization: Secondary | ICD-10-CM | POA: Diagnosis not present

## 2023-03-14 DIAGNOSIS — E785 Hyperlipidemia, unspecified: Secondary | ICD-10-CM | POA: Diagnosis not present

## 2023-03-14 DIAGNOSIS — F3341 Major depressive disorder, recurrent, in partial remission: Secondary | ICD-10-CM | POA: Diagnosis not present

## 2023-03-14 DIAGNOSIS — I421 Obstructive hypertrophic cardiomyopathy: Secondary | ICD-10-CM | POA: Diagnosis not present

## 2023-03-14 DIAGNOSIS — E2839 Other primary ovarian failure: Secondary | ICD-10-CM | POA: Diagnosis not present

## 2023-04-09 ENCOUNTER — Other Ambulatory Visit: Payer: Self-pay | Admitting: Cardiology

## 2023-04-09 DIAGNOSIS — I421 Obstructive hypertrophic cardiomyopathy: Secondary | ICD-10-CM

## 2023-05-09 ENCOUNTER — Telehealth: Payer: Self-pay | Admitting: *Deleted

## 2023-05-09 NOTE — Telephone Encounter (Signed)
   Pre-operative Risk Assessment    Patient Name: [name] [name]  DOB: [birth year] MRN: [id number]   Date of last office visit: 06/14/22 DR. LADONA Date of next office visit: 06/14/23 DR. GANJI-1 YR F/U   Request for Surgical Clearance    Procedure:  COLONOSCOPY  Date of Surgery:  Clearance 06/16/23                                Surgeon:  DR. MANN Surgeon's Group or Practice Name:  [facility] Phone number:  [phone] Fax number:  [phone]   Type of Clearance Requested:   - Medical ; NONE INDICATED TO BE HELD   Type of Anesthesia:   PROPOFOL    Additional requests/questions:    Bonney Niels Jest   05/09/2023, 6:04 PM

## 2023-05-10 NOTE — Telephone Encounter (Signed)
   Name: [name] [name]  DOB: [birth year]  MRN: [id number]  Primary Cardiologist: None  Chart reviewed as part of pre-operative protocol coverage. The patient has an upcoming visit scheduled with Dr. Ladona on 06/14/2022 at which time clearance can be addressed in case there are any issues that would impact surgical recommendations.  I added preop FYI to appointment note so that provider is aware to address at time of outpatient visit.  Per office protocol the cardiology provider should forward their finalized clearance decision and recommendations regarding antiplatelet therapy to the requesting party below.    I will route this message as FYI to requesting party and remove this message from the preop box as separate preop APP input not needed at this time.   Please call with any questions.  [name] [name], [name] Shove, NP  05/10/2023, 7:27 AM

## 2023-05-22 ENCOUNTER — Other Ambulatory Visit: Payer: Self-pay | Admitting: Gastroenterology

## 2023-05-24 ENCOUNTER — Telehealth: Payer: Self-pay | Admitting: Cardiology

## 2023-05-24 NOTE — Telephone Encounter (Signed)
See previous clearance encounter. Patient is following up. She states her ejection fraction was low last time she had a colonoscopy and her surgeon is concerned. She would like to know if she can be seen sooner for clearance.

## 2023-05-25 NOTE — Telephone Encounter (Signed)
Left message for the pt to call back to schedule a sooner appt in office for preop clearance.

## 2023-05-29 NOTE — Telephone Encounter (Signed)
 I s/w the pt today and she tells me the procedure with Dr. Elnoria Howard has been cancelled until she has been cleared. Pt confirmed her appt with Dr. Jacinto Halim 06/14/23 2 pm. Pt states she wants to wait to see Dr. Jacinto Halim before proceeding with GI procedure. I will update all parties involved.      YouJust now (10:39 AM)    See clearance notes for further info.       Note   You  [name], [name] [phone] now (10:39 AM)   [name] [name] days ago    Left message for the pt to call back to schedule a sooner appt in office for preop clearance.       Note   You  [name], [name] [phone] days ago   [name] [name] routed conversation to Cv Div Preop Callback5 days ago   [name] Carl E5 days ago   [name] [name] See previous clearance encounter. Patient is following up. She states her ejection fraction was low last time she had a colonoscopy and her surgeon is concerned. She would like to know if she can be seen sooner for clearance.

## 2023-05-29 NOTE — Telephone Encounter (Signed)
 See clearance notes for further info

## 2023-06-14 ENCOUNTER — Ambulatory Visit: Payer: PPO | Attending: Cardiology | Admitting: Cardiology

## 2023-06-14 ENCOUNTER — Encounter: Payer: Self-pay | Admitting: Cardiology

## 2023-06-14 VITALS — BP 138/57 | HR 67 | Resp 16 | Ht 64.0 in | Wt 163.6 lb

## 2023-06-14 DIAGNOSIS — I119 Hypertensive heart disease without heart failure: Secondary | ICD-10-CM | POA: Diagnosis not present

## 2023-06-14 DIAGNOSIS — I10 Essential (primary) hypertension: Secondary | ICD-10-CM | POA: Diagnosis not present

## 2023-06-14 MED ORDER — NEBIVOLOL HCL 10 MG PO TABS: 10.0000 mg | ORAL_TABLET | Freq: Every day | ORAL | Status: DC

## 2023-06-14 NOTE — Progress Notes (Signed)
 Cardiology Office Note:  .   Date:  06/14/2023  ID:  [name] [name], DOB 29-Jul-1950, MRN [id number] PCP: Lorenda Ishihara, MD  [facility] Providers Cardiologist:  Yates Decamp, MD   History of Present Illness: .   [name] [name] is a 73 y.o. female with hyperlipidemia, hypertensive heart disease with mild LVOT obstruction, chronic fatigue, depression, difficulty in tolerating beta blockers, as well as intolerance to medications.  Except for chronic dyspnea and mild fatigue, she has no specific complaints.  States that she had another bout of COVID-19.  Discussed the use of AI scribe software for clinical note transcription with the patient, who gave verbal consent to proceed.  History of Present Illness   The patient, with a history of cardiac issues, presents with increased shortness of breath, fatigue, and coughing following a second bout of COVID-19. She also reports a sensation of pressure in the chest, likening it to the feeling of being hit hard. The patient has noticed weight gain, particularly in the legs, and questions whether this is due to fat accumulation or edema. The patient is due for a colonoscopy and expresses concerns about the procedure. She is currently on Bystolic for blood pressure management and has been advised to increase the dose if blood pressure readings consistently exceed 130. The patient is also active, taking long walks with her horse.      Labs   Lab Results  Component Value Date   CHOL 212 (H) 07/12/2012   HDL 62 07/12/2012   LDLCALC 135 (H) 07/12/2012   TRIG 77 07/12/2012   CHOLHDL 3.4 07/12/2012   Lab Results  Component Value Date   NA 141 07/12/2012   K 4.3 07/12/2012   CO2 30 07/12/2012   GLUCOSE 113 (H) 07/12/2012   BUN 19 07/12/2012   CREATININE 0.88 07/12/2012   CALCIUM 9.8 07/12/2012      Latest Ref Rng & Units 07/12/2012    9:26 AM  BMP  Glucose 70 - 99 mg/dL 324   BUN 6 - 23 mg/dL 19   Creatinine 4.01 - 1.10 mg/dL 0.27   Sodium  253 - 664 mEq/L 141   Potassium 3.5 - 5.3 mEq/L 4.3   Chloride 96 - 112 mEq/L 104   CO2 19 - 32 mEq/L 30   Calcium 8.4 - 10.5 mg/dL 9.8       Latest Ref Rng & Units 05/19/2021    9:25 AM 07/12/2012    9:26 AM 08/17/2011    2:50 PM  CBC  WBC 3.4 - 10.8 x10E3/uL 8.1  6.3  7.3   Hemoglobin 11.1 - 15.9 g/dL 40.3  47.4  25.9   Hematocrit 34.0 - 46.6 % 43.8  43.7  39.8   Platelets 150 - 450 x10E3/uL 269  263  266    Lab Results  Component Value Date   HGBA1C 5.7 (H) 07/12/2012    Lab Results  Component Value Date   TSH 1.480 07/12/2012   Review of Systems  Constitutional: Positive for malaise/fatigue.  Cardiovascular:  Positive for dyspnea on exertion. Negative for chest pain and leg swelling.   Physical Exam:   VS:  BP (!) 138/57 (BP Location: Left Arm, Patient Position: Sitting, Cuff Size: Normal)   Pulse 67   Resp 16   Ht 5\' 4"  (1.626 m)   Wt 163 lb 9.6 oz (74.2 kg)   SpO2 97%   BMI 28.08 kg/m    Wt Readings from Last 3 Encounters:  06/14/23 163 lb 9.6  oz (74.2 kg)  06/14/22 162 lb (73.5 kg)  06/04/21 160 lb 12.8 oz (72.9 kg)    Physical Exam Neck:     Vascular: No JVD.  Cardiovascular:     Rate and Rhythm: Normal rate and regular rhythm.     Pulses: Intact distal pulses.     Heart sounds: S1 normal and S2 normal. Murmur heard.     Midsystolic murmur is present with a grade of 3/6 at the upper right sternal border.     No gallop.  Pulmonary:     Effort: Pulmonary effort is normal.     Breath sounds: Normal breath sounds.  Abdominal:     General: Bowel sounds are normal.     Palpations: Abdomen is soft.  Musculoskeletal:     Right lower leg: No edema.     Left lower leg: No edema.    Studies Reviewed: Marland Kitchen    MR CARDIAC MORPHOLOGY W WO CONTRAST 05/25/2021  1. Asymmetric LV hypertrophy measuring 13mm in basal septum (6mm in posterior wall), not meeting criteria for hypertrophic cardiomyopathy (less than 15mm)  2.  No late gadolinium enhancement to suggest  myocardial scar  3.  Normal LV size with hyperdynamic systolic function (EF 77%)  4.  Normal RV size and systolic function (EF 78%)  EKG:    EKG Interpretation Date/Time:  Wednesday June 14 2023 14:19:01 EDT Ventricular Rate:  73 PR Interval:  190 QRS Duration:  72 QT Interval:  390 QTC Calculation: 429 R Axis:   -1  Text Interpretation: EKG 06/14/2023: Normal sinus rhythm at the rate of 73 bpm, LVH by voltage criteria in aVL.  Otherwise normal. Confirmed by Delrae Rend [phone]) on 06/14/2023 2:34:46 PM    Medications and allergies    Allergies  Allergen Reactions   Sulfa Antibiotics Nausea Only     Current Outpatient Medications:    albuterol (VENTOLIN HFA) 108 (90 Base) MCG/ACT inhaler, Inhale 2 puffs into the lungs every 6 (six) hours as needed for wheezing or shortness of breath., Disp: 1 g, Rfl: 2   clonazePAM (KLONOPIN) 0.5 MG tablet, Take 0.5 mg by mouth 2 (two) times daily as needed., Disp: , Rfl:    docusate sodium (COLACE) 100 MG capsule, Take 100 mg by mouth daily., Disp: , Rfl:    escitalopram (LEXAPRO) 20 MG tablet, Take 20 mg by mouth daily., Disp: , Rfl:    ibuprofen (ADVIL,MOTRIN) 600 MG tablet, 600 mg by mouth every 6 hours for 3 days then 600 mg by mouth every 6 hours as needed for pain, Disp: 60 tablet, Rfl: 2   losartan (COZAAR) 25 MG tablet, TAKE ONE TABLET BY MOUTH ONCE DAILY, Disp: 90 tablet, Rfl: 0   nebivolol (BYSTOLIC) 10 MG tablet, Take 1 tablet (10 mg total) by mouth daily., Disp: , Rfl:    NON FORMULARY, Take by mouth daily. Weem gummy, Disp: , Rfl:    Vitamin D, Cholecalciferol, 50 MCG (2000 UT) CAPS, Take 1 tablet by mouth daily., Disp: , Rfl:    ASSESSMENT AND PLAN: .      ICD-10-CM   1. Hypertensive left ventricular hypertrophy, without heart failure  I11.9 EKG 12-Lead    nebivolol (BYSTOLIC) 10 MG tablet    2. Essential hypertension  I10 nebivolol (BYSTOLIC) 10 MG tablet      Assessment and Plan    Shortness of breath and fatigue  post-COVID   She experiences increased shortness of breath and fatigue following a recent COVID-19 infection, likely worsened  by existing LVOT obstruction. The shortness of breath during exertion is not life-threatening, and continued exercise is important for cardiovascular health. Increase Bystolic to 10 mg to address LVOT obstruction and improve shortness of breath. Monitor blood pressure at rest and during activities; maintain the increased Bystolic dose if consistently above 130 mmHg. Contact if significant fatigue or adverse effects from the increased dose occur.  Heart murmur due to LVOT obstruction   She has a longstanding heart murmur, graded 3/6, related to LVOT obstruction from thickened heart muscle causing systolic narrowing. The murmur is not indicative of heart failure or immediate risk. Monitor the heart murmur during routine check-ups and educate her on the murmur's nature and implications.  Hypertension   Blood pressure readings at home are 128-130/60s, higher in clinical settings. The current Bystolic dose may be insufficient for activity-related blood pressure control. Increasing the dose may also aid shortness of breath management. Increase Bystolic to 10 mg and monitor blood pressure regularly. Adjust the Bystolic dose based on blood pressure readings and symptoms.  Colonoscopy preparation   She is scheduled for a colonoscopy and expresses concern about the procedure. She is low risk, and the procedure can be safely performed in an office setting. Send a note to Dr. Charna Elizabeth regarding suitability for office-based colonoscopy. She is not on anticoagulants or antiplatelet therapy.  Goals of Care   She expresses a desire for a peaceful and unnoticed passing, reflecting on aging and end-of-life considerations. Reassure her about her current health status and acknowledge and support her thoughts on aging and end-of-life care.  Follow-up   She is advised to follow up in one year  unless symptoms change or worsen. Schedule a follow-up appointment in one year and contact if symptoms change or worsen before the scheduled follow-up.           Signed,  Yates Decamp, MD, [facility] 06/14/2023, 3:58 PM [facility] [address] [place], [place] [address] Phone: [phone]. Fax:  [phone]

## 2023-06-14 NOTE — Patient Instructions (Signed)
 Medication Instructions:  Your physician has recommended you make the following change in your medication:  1) INCREASE nebivolol (Bystolic) to 10 mg daily  *If you need a refill on your cardiac medications before your next appointment, please call your pharmacy*  Follow-Up: At [facility], you and your health needs are our priority.  As part of our continuing mission to provide you with exceptional heart care, we have created designated Provider Care Teams.  These Care Teams include your primary Cardiologist (physician) and Advanced Practice Providers (APPs -  Physician Assistants and Nurse Practitioners) who all work together to provide you with the care you need, when you need it.  Your next appointment:   1 year(s)  The format for your next appointment:   In Person  Provider:   Yates Decamp, MD {  Other Instructions   1st Floor: - Lobby - Registration  - Pharmacy  - Lab - Cafe  2nd Floor: - PV Lab - Diagnostic Testing (echo, CT, nuclear med)  3rd Floor: - Vacant  4th Floor: - TCTS (cardiothoracic surgery) - AFib Clinic - Structural Heart Clinic - Vascular Surgery  - Vascular Ultrasound  5th Floor: - HeartCare Cardiology (general and EP) - Clinical Pharmacy for coumadin, hypertension, lipid, weight-loss medications, and med management appointments    Valet parking services will be available as well.

## 2023-06-16 ENCOUNTER — Encounter (HOSPITAL_COMMUNITY): Payer: Self-pay

## 2023-06-16 ENCOUNTER — Ambulatory Visit (HOSPITAL_COMMUNITY): Admit: 2023-06-16 | Payer: PPO | Admitting: Gastroenterology

## 2023-06-16 SURGERY — COLONOSCOPY WITH PROPOFOL
Anesthesia: Monitor Anesthesia Care

## 2023-06-28 ENCOUNTER — Other Ambulatory Visit: Payer: Self-pay | Admitting: Cardiology

## 2023-06-28 DIAGNOSIS — I10 Essential (primary) hypertension: Secondary | ICD-10-CM

## 2023-06-29 ENCOUNTER — Other Ambulatory Visit: Payer: Self-pay | Admitting: Cardiology

## 2023-06-29 DIAGNOSIS — I119 Hypertensive heart disease without heart failure: Secondary | ICD-10-CM

## 2023-06-29 DIAGNOSIS — I10 Essential (primary) hypertension: Secondary | ICD-10-CM

## 2023-06-29 MED ORDER — NEBIVOLOL HCL 10 MG PO TABS: 10.0000 mg | ORAL_TABLET | Freq: Every day | ORAL | 3 refills | Status: DC

## 2023-07-11 ENCOUNTER — Other Ambulatory Visit: Payer: Self-pay | Admitting: Cardiology

## 2023-07-11 DIAGNOSIS — I421 Obstructive hypertrophic cardiomyopathy: Secondary | ICD-10-CM

## 2023-07-18 DIAGNOSIS — L249 Irritant contact dermatitis, unspecified cause: Secondary | ICD-10-CM | POA: Diagnosis not present

## 2023-09-06 DIAGNOSIS — I421 Obstructive hypertrophic cardiomyopathy: Secondary | ICD-10-CM | POA: Diagnosis not present

## 2023-09-06 DIAGNOSIS — J310 Chronic rhinitis: Secondary | ICD-10-CM | POA: Diagnosis not present

## 2023-09-06 DIAGNOSIS — E785 Hyperlipidemia, unspecified: Secondary | ICD-10-CM | POA: Diagnosis not present

## 2023-09-06 DIAGNOSIS — M25551 Pain in right hip: Secondary | ICD-10-CM | POA: Diagnosis not present

## 2023-09-06 DIAGNOSIS — M25522 Pain in left elbow: Secondary | ICD-10-CM | POA: Diagnosis not present

## 2023-09-06 DIAGNOSIS — F419 Anxiety disorder, unspecified: Secondary | ICD-10-CM | POA: Diagnosis not present

## 2023-09-06 DIAGNOSIS — E559 Vitamin D deficiency, unspecified: Secondary | ICD-10-CM | POA: Diagnosis not present

## 2023-09-06 DIAGNOSIS — F3341 Major depressive disorder, recurrent, in partial remission: Secondary | ICD-10-CM | POA: Diagnosis not present

## 2023-09-12 DIAGNOSIS — E559 Vitamin D deficiency, unspecified: Secondary | ICD-10-CM | POA: Diagnosis not present

## 2023-09-12 DIAGNOSIS — E785 Hyperlipidemia, unspecified: Secondary | ICD-10-CM | POA: Diagnosis not present

## 2023-09-25 DIAGNOSIS — M25551 Pain in right hip: Secondary | ICD-10-CM | POA: Diagnosis not present

## 2023-09-25 DIAGNOSIS — M25552 Pain in left hip: Secondary | ICD-10-CM | POA: Diagnosis not present

## 2023-10-24 ENCOUNTER — Telehealth: Payer: Self-pay | Admitting: Cardiology

## 2023-10-24 DIAGNOSIS — I119 Hypertensive heart disease without heart failure: Secondary | ICD-10-CM

## 2023-10-24 DIAGNOSIS — I421 Obstructive hypertrophic cardiomyopathy: Secondary | ICD-10-CM

## 2023-10-24 DIAGNOSIS — I10 Essential (primary) hypertension: Secondary | ICD-10-CM

## 2023-10-24 MED ORDER — NEBIVOLOL HCL 10 MG PO TABS: 10.0000 mg | ORAL_TABLET | Freq: Every day | ORAL | 0 refills | Status: AC

## 2023-10-24 MED ORDER — LOSARTAN POTASSIUM 25 MG PO TABS: 25.0000 mg | ORAL_TABLET | Freq: Every day | ORAL | 0 refills | Status: AC

## 2023-10-24 NOTE — Telephone Encounter (Signed)
 Will be happy to send her Rx to local pharmacy if needed. May be easier if she simply restarts when she gets home

## 2023-10-24 NOTE — Telephone Encounter (Signed)
 Received incoming call. Pt states, I went to the pharmacy here and they said that they get prescriptions sent here all the time from other parts of the [place]    Chris P. Was able to send in RX to : [facility] [phone] at [place] for 2 days worth of Nebivolol  and Losartan .   Pt very thankful, saying, Thank you so much for going the extra mile.   Also advised pt on purchasing compression hose for traveling to aide in preventing swelling/dvt. She states she will purchase at the Pharmacy there. No other concerns.
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
## 2023-10-24 NOTE — Telephone Encounter (Signed)
 New Message:     Patient says she is in [facility] and have misplaced all of her medicine. She says she does not know what to do.

## 2023-10-24 NOTE — Telephone Encounter (Signed)
Rx sent to pharmacy per pt request 

## 2023-10-26 DIAGNOSIS — S0990XA Unspecified injury of head, initial encounter: Secondary | ICD-10-CM | POA: Diagnosis not present

## 2023-10-26 DIAGNOSIS — W19XXXA Unspecified fall, initial encounter: Secondary | ICD-10-CM | POA: Diagnosis not present

## 2023-10-27 ENCOUNTER — Ambulatory Visit (HOSPITAL_BASED_OUTPATIENT_CLINIC_OR_DEPARTMENT_OTHER)
Admission: RE | Admit: 2023-10-27 | Discharge: 2023-10-27 | Disposition: A | Discharge status: Home / Self Care | Source: Ambulatory Visit | Attending: Internal Medicine | Admitting: Internal Medicine

## 2023-10-27 ENCOUNTER — Other Ambulatory Visit (HOSPITAL_COMMUNITY): Payer: Self-pay | Admitting: Internal Medicine

## 2023-10-27 DIAGNOSIS — S0990XA Unspecified injury of head, initial encounter: Secondary | ICD-10-CM | POA: Diagnosis not present

## 2023-10-27 DIAGNOSIS — I6782 Cerebral ischemia: Secondary | ICD-10-CM | POA: Diagnosis not present

## 2023-11-03 DIAGNOSIS — M7061 Trochanteric bursitis, right hip: Secondary | ICD-10-CM | POA: Diagnosis not present

## 2023-11-03 DIAGNOSIS — M7062 Trochanteric bursitis, left hip: Secondary | ICD-10-CM | POA: Diagnosis not present

## 2023-12-12 DIAGNOSIS — Z860100 Personal history of colon polyps, unspecified: Secondary | ICD-10-CM | POA: Diagnosis not present

## 2023-12-12 DIAGNOSIS — Z1211 Encounter for screening for malignant neoplasm of colon: Secondary | ICD-10-CM | POA: Diagnosis not present

## 2023-12-12 DIAGNOSIS — K760 Fatty (change of) liver, not elsewhere classified: Secondary | ICD-10-CM | POA: Diagnosis not present

## 2023-12-12 DIAGNOSIS — K219 Gastro-esophageal reflux disease without esophagitis: Secondary | ICD-10-CM | POA: Diagnosis not present

## 2023-12-14 ENCOUNTER — Encounter (HOSPITAL_COMMUNITY): Payer: Self-pay | Admitting: Psychiatry

## 2023-12-14 ENCOUNTER — Ambulatory Visit (HOSPITAL_COMMUNITY): Admitting: Psychiatry

## 2023-12-14 ENCOUNTER — Other Ambulatory Visit: Payer: Self-pay

## 2023-12-14 VITALS — BP 117/74 | HR 73 | Resp 18 | Ht 64.0 in | Wt 164.0 lb

## 2023-12-14 DIAGNOSIS — F4311 Post-traumatic stress disorder, acute: Secondary | ICD-10-CM | POA: Diagnosis not present

## 2023-12-14 DIAGNOSIS — F411 Generalized anxiety disorder: Secondary | ICD-10-CM | POA: Diagnosis not present

## 2023-12-14 DIAGNOSIS — F331 Major depressive disorder, recurrent, moderate: Secondary | ICD-10-CM | POA: Diagnosis not present

## 2023-12-14 DIAGNOSIS — F4310 Post-traumatic stress disorder, unspecified: Secondary | ICD-10-CM | POA: Diagnosis not present

## 2023-12-14 MED ORDER — ESCITALOPRAM OXALATE 20 MG PO TABS: 20.0000 mg | ORAL_TABLET | Freq: Every day | ORAL | 1 refills | Status: DC
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
## 2023-12-29 DIAGNOSIS — I6523 Occlusion and stenosis of bilateral carotid arteries: Secondary | ICD-10-CM | POA: Diagnosis not present

## 2023-12-29 DIAGNOSIS — E785 Hyperlipidemia, unspecified: Secondary | ICD-10-CM | POA: Diagnosis not present

## 2023-12-29 DIAGNOSIS — H538 Other visual disturbances: Secondary | ICD-10-CM | POA: Diagnosis not present

## 2024-01-01 DIAGNOSIS — H43393 Other vitreous opacities, bilateral: Secondary | ICD-10-CM | POA: Diagnosis not present

## 2024-01-01 DIAGNOSIS — H524 Presbyopia: Secondary | ICD-10-CM | POA: Diagnosis not present

## 2024-01-01 DIAGNOSIS — H2513 Age-related nuclear cataract, bilateral: Secondary | ICD-10-CM | POA: Diagnosis not present

## 2024-01-04 ENCOUNTER — Ambulatory Visit (HOSPITAL_COMMUNITY): Admitting: Psychiatry

## 2024-01-04 ENCOUNTER — Encounter (HOSPITAL_COMMUNITY): Payer: Self-pay

## 2024-01-05 ENCOUNTER — Telehealth: Payer: Self-pay | Admitting: Cardiology

## 2024-01-05 ENCOUNTER — Encounter: Payer: Self-pay | Admitting: Neurology

## 2024-01-05 NOTE — Telephone Encounter (Signed)
 Pt reports a bizarre episode a couple weeks ago, vision went blurry, got very dizzy and had to grab the counter at [employer].  No syncope involved. She did go see her PCP and then ophthalmologist to r/o retinal cause. She would prefer to see Dr. Ladona and discuss this concern further  Will forward to schedulers to arrange OV w/ Dr. Ladona per pt request  (approved by Dr. Ladona if pt wishes to see him)

## 2024-01-05 NOTE — Telephone Encounter (Signed)
 Pt is requesting a callback regarding her wanting to discuss how to prevent from having another mini stroke. Please advise

## 2024-01-08 NOTE — Telephone Encounter (Signed)
 Pt scheduled for 04/08/24 with Dr. Ladona.

## 2024-01-09 ENCOUNTER — Telehealth: Payer: Self-pay | Admitting: Neurology

## 2024-01-09 NOTE — Telephone Encounter (Signed)
 Pt called in and stated that her PCP thinks she had a TIA, and so Pt is worry about having a stroke. Please call her. Thanks

## 2024-01-10 ENCOUNTER — Ambulatory Visit: Admitting: Neurology

## 2024-01-10 ENCOUNTER — Encounter: Payer: Self-pay | Admitting: Neurology

## 2024-01-10 VITALS — BP 125/75 | HR 69 | Ht 64.0 in | Wt 165.0 lb

## 2024-01-10 DIAGNOSIS — H539 Unspecified visual disturbance: Secondary | ICD-10-CM

## 2024-01-10 NOTE — Patient Instructions (Signed)
 MRI brain without contrast MRA head and neck
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
## 2024-01-25 ENCOUNTER — Ambulatory Visit (HOSPITAL_COMMUNITY): Admitting: Psychiatry

## 2024-02-08 ENCOUNTER — Telehealth: Payer: Self-pay | Admitting: Neurology

## 2024-02-08 ENCOUNTER — Other Ambulatory Visit: Payer: Self-pay | Admitting: Gastroenterology

## 2024-02-08 NOTE — Telephone Encounter (Signed)
 If she already has clonazepam, that would be fine.  Otherwise, I can prescribe diazepam to take 30-min prior to MRI.  She would need to be sure she has someone to drive her.  Thanks.

## 2024-02-08 NOTE — Telephone Encounter (Signed)
 Pt has her clonazepam she is going to take one before her MRI her husband is going to driver her ,

## 2024-02-08 NOTE — Telephone Encounter (Signed)
 Pt called in this morning. Pt stated that she is having an MRI done on Monday 02-12-24 so she wants to know can she take some clonazePAM (KLONOPIN)  before the MRI. Please call her. Thanks

## 2024-02-12 ENCOUNTER — Ambulatory Visit
Admission: RE | Admit: 2024-02-12 | Discharge: 2024-02-12 | Disposition: A | Discharge status: Home / Self Care | Source: Ambulatory Visit | Attending: Neurology | Admitting: Neurology

## 2024-02-12 ENCOUNTER — Ambulatory Visit
Admission: RE | Admit: 2024-02-12 | Discharge: 2024-02-12 | Disposition: A | Source: Ambulatory Visit | Attending: Neurology

## 2024-02-12 DIAGNOSIS — H539 Unspecified visual disturbance: Secondary | ICD-10-CM

## 2024-02-12 DIAGNOSIS — H547 Unspecified visual loss: Secondary | ICD-10-CM | POA: Diagnosis not present

## 2024-02-12 DIAGNOSIS — G319 Degenerative disease of nervous system, unspecified: Secondary | ICD-10-CM | POA: Diagnosis not present

## 2024-02-12 MED ORDER — GADOPICLENOL 0.5 MMOL/ML IV SOLN
7.0000 mL | Freq: Once | INTRAVENOUS | Status: AC | PRN
  Administered 2024-02-12: 7 mL via INTRAVENOUS

## 2024-02-15 ENCOUNTER — Ambulatory Visit (HOSPITAL_COMMUNITY): Admitting: Psychiatry

## 2024-02-15 ENCOUNTER — Encounter (HOSPITAL_COMMUNITY): Payer: Self-pay | Admitting: Psychiatry

## 2024-02-15 ENCOUNTER — Other Ambulatory Visit: Payer: Self-pay

## 2024-02-15 VITALS — BP 128/75 | HR 65 | Ht 64.0 in | Wt 169.0 lb

## 2024-02-15 DIAGNOSIS — F411 Generalized anxiety disorder: Secondary | ICD-10-CM | POA: Diagnosis not present

## 2024-02-15 DIAGNOSIS — F331 Major depressive disorder, recurrent, moderate: Secondary | ICD-10-CM

## 2024-02-15 DIAGNOSIS — F4310 Post-traumatic stress disorder, unspecified: Secondary | ICD-10-CM

## 2024-02-15 MED ORDER — ESCITALOPRAM OXALATE 20 MG PO TABS: 20.0000 mg | ORAL_TABLET | Freq: Every day | ORAL | 2 refills | Status: AC
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
## 2024-02-16 ENCOUNTER — Ambulatory Visit: Payer: Self-pay | Admitting: Neurology

## 2024-03-08 ENCOUNTER — Telehealth (HOSPITAL_BASED_OUTPATIENT_CLINIC_OR_DEPARTMENT_OTHER): Payer: Self-pay

## 2024-03-08 ENCOUNTER — Encounter (HOSPITAL_COMMUNITY): Payer: Self-pay | Admitting: Gastroenterology

## 2024-03-08 NOTE — Telephone Encounter (Signed)
   Pre-operative Risk Assessment    Patient Name: [name] [name]  DOB: 1951-01-04 MRN: [id number]   Date of last office visit: 06/14/23 with Dr. Ladona Date of next office visit: 04/08/24 with Dr. Ladona  Request for Surgical Clearance    Procedure:  Colonoscopy  Date of Surgery:  Clearance 03/15/24                                 Surgeon:  Dr. Rollin Socks Group or Practice Name:  [facility] Phone number:  [phone] Fax number:  [phone]   Type of Clearance Requested:   - Medical    Type of Anesthesia:  propofol    Additional requests/questions:    Bonney Augustin JONETTA Delores   03/08/2024, 3:54 PM

## 2024-03-08 NOTE — Progress Notes (Signed)
 Attempted to obtain medical history for pre op call via telephone, unable to reach at this time. HIPAA compliant voicemail message left requesting return call to pre surgical testing department.

## 2024-03-08 NOTE — Telephone Encounter (Signed)
 Patient scheduled for pre-op clearance on 03/12/24 with Rosaline Bane, NP.     Patient Consent for Virtual Visit        [name] [name] has provided verbal consent on 03/08/2024 for a virtual visit (video or telephone).   CONSENT FOR VIRTUAL VISIT FOR:  [name] [name]  By participating in this virtual visit I agree to the following:  I hereby voluntarily request, consent and authorize [facility] and its employed or contracted physicians, physician assistants, nurse practitioners or other licensed health care professionals (the Practitioner), to provide me with telemedicine health care services (the "Services) as deemed necessary by the treating Practitioner. I acknowledge and consent to receive the Services by the Practitioner via telemedicine. I understand that the telemedicine visit will involve communicating with the Practitioner through live audiovisual communication technology and the disclosure of certain medical information by electronic transmission. I acknowledge that I have been given the opportunity to request an in-person assessment or other available alternative prior to the telemedicine visit and am voluntarily participating in the telemedicine visit.  I understand that I have the right to withhold or withdraw my consent to the use of telemedicine in the course of my care at any time, without affecting my right to future care or treatment, and that the Practitioner or I may terminate the telemedicine visit at any time. I understand that I have the right to inspect all information obtained and/or recorded in the course of the telemedicine visit and may receive copies of available information for a reasonable fee.  I understand that some of the potential risks of receiving the Services via telemedicine include:  Delay or interruption in medical evaluation due to technological equipment failure or disruption; Information transmitted may not be sufficient (e.g. poor resolution of  images) to allow for appropriate medical decision making by the Practitioner; and/or  In rare instances, security protocols could fail, causing a breach of personal health information.  Furthermore, I acknowledge that it is my responsibility to provide information about my medical history, conditions and care that is complete and accurate to the best of my ability. I acknowledge that Practitioner's advice, recommendations, and/or decision may be based on factors not within their control, such as incomplete or inaccurate data provided by me or distortions of diagnostic images or specimens that may result from electronic transmissions. I understand that the practice of medicine is not an exact science and that Practitioner makes no warranties or guarantees regarding treatment outcomes. I acknowledge that a copy of this consent can be made available to me via my patient portal [facility] MyChart), or I can request a printed copy by calling the office of [facility].    I understand that my insurance will be billed for this visit.   I have read or had this consent read to me. I understand the contents of this consent, which adequately explains the benefits and risks of the Services being provided via telemedicine.  I have been provided ample opportunity to ask questions regarding this consent and the Services and have had my questions answered to my satisfaction. I give my informed consent for the services to be provided through the use of telemedicine in my medical care

## 2024-03-08 NOTE — Telephone Encounter (Signed)
 Patient called to report an episode of of blurry vision and dizziness on 01/05/24. She requested an appointment with Dr. Ladona which is scheduled for 04/08/24. If patient wishes to have colonoscopy prior to that visit, we will have to schedule a virtual visit. She was last seen March 2025.   [name], NP-C  03/08/2024, 4:00 PM [address], [address] [place], [place] [address] Office [phone] Fax [phone]

## 2024-03-12 ENCOUNTER — Ambulatory Visit: Attending: Cardiology

## 2024-03-12 DIAGNOSIS — Z01810 Encounter for preprocedural cardiovascular examination: Secondary | ICD-10-CM | POA: Diagnosis not present
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
## 2024-03-15 ENCOUNTER — Other Ambulatory Visit: Payer: Self-pay

## 2024-03-15 ENCOUNTER — Ambulatory Visit (HOSPITAL_COMMUNITY)

## 2024-03-15 ENCOUNTER — Encounter (HOSPITAL_COMMUNITY): Payer: Self-pay | Admitting: Gastroenterology

## 2024-03-15 ENCOUNTER — Ambulatory Visit (HOSPITAL_COMMUNITY)
Admission: RE | Admit: 2024-03-15 | Discharge: 2024-03-15 | Disposition: A | Attending: Gastroenterology | Admitting: Gastroenterology

## 2024-03-15 ENCOUNTER — Encounter (HOSPITAL_COMMUNITY): Admission: RE | Disposition: A | Payer: Self-pay | Source: Home / Self Care | Attending: Gastroenterology

## 2024-03-15 DIAGNOSIS — Z1211 Encounter for screening for malignant neoplasm of colon: Secondary | ICD-10-CM | POA: Insufficient documentation

## 2024-03-15 DIAGNOSIS — Z800 Family history of malignant neoplasm of digestive organs: Secondary | ICD-10-CM | POA: Insufficient documentation

## 2024-03-15 DIAGNOSIS — Z860101 Personal history of adenomatous and serrated colon polyps: Secondary | ICD-10-CM | POA: Diagnosis present

## 2024-03-15 DIAGNOSIS — F418 Other specified anxiety disorders: Secondary | ICD-10-CM | POA: Diagnosis not present

## 2024-03-15 DIAGNOSIS — J45909 Unspecified asthma, uncomplicated: Secondary | ICD-10-CM | POA: Insufficient documentation

## 2024-03-15 DIAGNOSIS — K219 Gastro-esophageal reflux disease without esophagitis: Secondary | ICD-10-CM | POA: Insufficient documentation

## 2024-03-15 DIAGNOSIS — Z825 Family history of asthma and other chronic lower respiratory diseases: Secondary | ICD-10-CM | POA: Diagnosis not present

## 2024-03-15 HISTORY — PX: COLONOSCOPY: SHX5424

## 2024-03-15 SURGERY — COLONOSCOPY
Anesthesia: Monitor Anesthesia Care

## 2024-03-15 MED ORDER — LIDOCAINE 2% (20 MG/ML) 5 ML SYRINGE
INTRAMUSCULAR | Status: DC | PRN
  Administered 2024-03-15: 100 mg via INTRAVENOUS

## 2024-03-15 MED ORDER — SODIUM CHLORIDE 0.9 % IV SOLN: INTRAVENOUS | Status: DC | PRN

## 2024-03-15 MED ORDER — PROPOFOL 10 MG/ML IV BOLUS
INTRAVENOUS | Status: DC | PRN
  Administered 2024-03-15 (×2): 20 mg via INTRAVENOUS

## 2024-03-15 MED ORDER — PROPOFOL 500 MG/50ML IV EMUL
INTRAVENOUS | Status: DC | PRN
  Administered 2024-03-15: 200 ug/kg/min via INTRAVENOUS

## 2024-03-15 NOTE — Discharge Instructions (Signed)
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
## 2024-03-15 NOTE — Anesthesia Postprocedure Evaluation (Signed)
 Anesthesia Post Note  Patient: [name] [name]  Procedure(s) Performed: COLONOSCOPY     Patient location during evaluation: Endoscopy Anesthesia Type: MAC Level of consciousness: awake and alert Pain management: pain level controlled Vital Signs Assessment: post-procedure vital signs reviewed and stable Respiratory status: spontaneous breathing, nonlabored ventilation, respiratory function stable and patient connected to nasal cannula oxygen Cardiovascular status: stable and blood pressure returned to baseline Postop Assessment: no apparent nausea or vomiting Anesthetic complications: no   No notable events documented.  Last Vitals:  Vitals:   03/15/24 1041 03/15/24 1100  BP: (!) 146/47 (!) 162/58  Pulse: 65 63  Resp: (!) 23 18  Temp: (!) 36.2 C   SpO2: 100% 97%    Last Pain:  Vitals:   03/15/24 1100  TempSrc:   PainSc: 0-No pain                 [name] [name] S

## 2024-03-15 NOTE — Anesthesia Procedure Notes (Signed)
 Procedure Name: MAC Date/Time: 03/15/2024 10:13 AM  Performed by: Franchot Delon RAMAN, CRNAPre-anesthesia Checklist: Patient identified, Emergency Drugs available, Suction available and Patient being monitored Oxygen Delivery Method: Simple face mask Placement Confirmation: positive ETCO2 Dental Injury: Teeth and Oropharynx as per pre-operative assessment

## 2024-03-15 NOTE — Transfer of Care (Signed)
 Immediate Anesthesia Transfer of Care Note  Patient: [name] [name]  Procedure(s) Performed: COLONOSCOPY  Patient Location: PACU  Anesthesia Type:MAC  Level of Consciousness: awake, alert , and drowsy  Airway & Oxygen Therapy: Patient Spontanous Breathing and Patient connected to face mask oxygen  Post-op Assessment: Report given to RN and Post -op Vital signs reviewed and stable  Post vital signs: Reviewed and stable  Last Vitals:  Vitals Value Taken Time  BP 146/47 03/15/24 10:40  Temp    Pulse 64 03/15/24 10:43  Resp 23 03/15/24 10:43  SpO2 100 % 03/15/24 10:43  Vitals shown include unfiled device data.  Last Pain:  Vitals:   03/15/24 0844  TempSrc: Temporal  PainSc: 0-No pain         Complications: No notable events documented.

## 2024-03-15 NOTE — Anesthesia Preprocedure Evaluation (Signed)
 Anesthesia Evaluation  Patient identified by MRN, date of birth, ID band Patient awake    Reviewed: Allergy & Precautions, H&P , NPO status , Patient's Chart, lab work & pertinent test results  Airway Mallampati: II   Neck ROM: full    Dental   Pulmonary shortness of breath, asthma    breath sounds clear to auscultation       Cardiovascular negative cardio ROS  Rhythm:regular Rate:Normal     Neuro/Psych  Headaches PSYCHIATRIC DISORDERS Anxiety Depression       GI/Hepatic ,GERD  ,,  Endo/Other    Renal/GU      Musculoskeletal   Abdominal   Peds  Hematology   Anesthesia Other Findings   Reproductive/Obstetrics                              Anesthesia Physical Anesthesia Plan  ASA: 3  Anesthesia Plan: MAC   Post-op Pain Management:    Induction: Intravenous  PONV Risk Score and Plan: 2 and Propofol  infusion and Treatment may vary due to age or medical condition  Airway Management Planned: Nasal Cannula  Additional Equipment:   Intra-op Plan:   Post-operative Plan:   Informed Consent: I have reviewed the patients History and Physical, chart, labs and discussed the procedure including the risks, benefits and alternatives for the proposed anesthesia with the patient or authorized representative who has indicated his/her understanding and acceptance.     Dental advisory given  Plan Discussed with: CRNA, Anesthesiologist and Surgeon  Anesthesia Plan Comments:         Anesthesia Quick Evaluation

## 2024-03-15 NOTE — Op Note (Signed)
 [facility] Patient Name: [name] [name] Procedure Date: 03/15/2024 MRN: [id number] Attending MD: Belvie Just , MD, [id number] Date of Birth: Aug 02, 1950 CSN: [id number] Age: 73 Admit Type: Inpatient Procedure:                Colonoscopy Indications:              High risk colon cancer surveillance: Personal                            history of colonic polyps Providers:                Belvie Just, MD, Jacquelyn Jaci Pierce, RN, Felice Sar, Technician Referring MD:              Medicines:                Propofol  per Anesthesia Complications:            No immediate complications. Estimated Blood Loss:     Estimated blood loss: none. Procedure:                Pre-Anesthesia Assessment:                           - Prior to the procedure, a History and Physical                            was performed, and patient medications and                            allergies were reviewed. The patient's tolerance of                            previous anesthesia was also reviewed. The risks                            and benefits of the procedure and the sedation                            options and risks were discussed with the patient.                            All questions were answered, and informed consent                            was obtained. Prior Anticoagulants: The patient has                            taken no anticoagulant or antiplatelet agents. ASA                            Grade Assessment: III - A patient with severe  systemic disease. After reviewing the risks and                            benefits, the patient was deemed in satisfactory                            condition to undergo the procedure.                           - Sedation was administered by an anesthesia                            professional. Deep sedation was attained.                           After obtaining informed consent, the  colonoscope                            was passed under direct vision. Throughout the                            procedure, the patient's blood pressure, pulse, and                            oxygen saturations were monitored continuously. The                            CF-HQ190L ([id number]) Olympus colonoscope was                            introduced through the anus and advanced to the the                            cecum, identified by appendiceal orifice and                            ileocecal valve. The colonoscopy was performed                            without difficulty. The patient tolerated the                            procedure well. The quality of the bowel                            preparation was evaluated using the BBPS [facility]                            Bowel Preparation Scale) with scores of: Right                            Colon = 3, Transverse Colon = 3 and Left Colon = 3                            (  entire mucosa seen well with no residual staining,                            small fragments of stool or opaque liquid). The                            total BBPS score equals 9. The ileocecal valve,                            appendiceal orifice, and rectum were photographed. Scope In: 10:22:44 AM Scope Out: 10:36:33 AM Scope Withdrawal Time: 0 hours 9 minutes 52 seconds  Total Procedure Duration: 0 hours 13 minutes 49 seconds  Findings:      The entire examined colon appeared normal. Impression:               - The entire examined colon is normal.                           - No specimens collected. Moderate Sedation:      Not Applicable - Patient had care per Anesthesia. Recommendation:           - Patient has a contact number available for                            emergencies. The signs and symptoms of potential                            delayed complications were discussed with the                            patient. Return to normal activities tomorrow.                             Written discharge instructions were provided to the                            patient.                           - Resume regular diet.                           - Continue present medications.                           - Repeat colonoscopy is not recommended for                            surveillance. Procedure Code(s):        --- Professional ---                           [phone], Colonoscopy, flexible; diagnostic, including                            collection of specimen(s) by brushing or washing,  when performed (separate procedure) Diagnosis Code(s):        --- Professional ---                           Z86.010, Personal history of colonic polyps CPT copyright 2022 American Medical Association. All rights reserved. The codes documented in this report are preliminary and upon coder review may  be revised to meet current compliance requirements. Belvie Just, MD Belvie Just, MD 03/15/2024 10:45:29 AM This report has been signed electronically. Number of Addenda: 0

## 2024-03-17 ENCOUNTER — Encounter (HOSPITAL_COMMUNITY): Payer: Self-pay | Admitting: Gastroenterology

## 2024-03-23 ENCOUNTER — Other Ambulatory Visit: Payer: Self-pay | Admitting: Cardiology

## 2024-03-23 DIAGNOSIS — J45990 Exercise induced bronchospasm: Secondary | ICD-10-CM

## 2024-03-25 NOTE — Telephone Encounter (Signed)
 Pt of Dr. Ladona. Does Dr. Ladona want to refill this RX? Please advise.

## 2024-04-07 NOTE — Progress Notes (Unsigned)
 " Cardiology Office Note:  .   Date:  04/08/2024  ID:  [name] [name], DOB 24-Jun-1950, MRN [id number] PCP: Elliot Charm, MD   HeartCare Providers Cardiologist:  Gordy Bergamo, MD   History of Present Illness: .   [name] [name] is a 74 y.o. female with hyperlipidemia, hypertensive heart disease with mild LVOT obstruction, chronic fatigue, chronic dyspnea on exertion depression, difficulty in tolerating beta blockers, as well as intolerance to medications.  Patient has had visual disturbances and was evaluated extensively including MRI of the head and neck to exclude stroke, 02/12/2024 MRI revealing no significant abnormality to explain patient's symptoms and mild age-related cerebral atrophy.  She continues to endorse occasional episodes of sudden onset shortness of breath that last a few seconds and also is asking why she gets dizzy when she suddenly stands up after a long travel in the car.  Otherwise remains asymptomatic and continues to ride her horses, able to continue on and do activities of daily living without any limitations.    Discussed the use of AI scribe software for clinical note transcription with the patient, who gave verbal consent to proceed.  History of Present Illness [name] [name] is a 74 year old female with hypertension who presents with episodes of shortness of breath and dizziness.  She has sudden brief episodes of shortness of breath described as little gasps that occur at rest or with activity, leave her out of breath, and sometimes prevent her from finishing a sentence. She has chronic shortness of breath and has had prior workup at [facility] for blood clots, coronary disease, and lung disease, including a stress test and cardiac MRI in 2023. She uses an albuterol  inhaler as needed, including when walking her horse.  She becomes lightheaded with blurry vision when she stands after sitting in a car for about two hours and needs to hold onto something for support. She  takes nebivolol  10 mg and losartan  25 mg nightly.  She has hypertension treated with nebivolol  and losartan  and takes atorvastatin 10 mg for hyperlipidemia. Recent labs showed LDL 91 and HDL 52.  Cardiac Studies relevent.    MR CARDIAC MORPHOLOGY W WO CONTRAST 05/25/2021  1. Asymmetric LV hypertrophy measuring 13mm in basal septum (6mm in posterior wall), not meeting criteria for hypertrophic cardiomyopathy (less than 15mm)  2.  No late gadolinium enhancement to suggest myocardial scar  3.  Normal LV size with hyperdynamic systolic function (EF 77%)  4.  Normal RV size and systolic function (EF 78%)  Echocardiogram 04/27/2021: Normal LV systolic function with EF 55%. Left ventricle cavity is normal in size. Mild concentric remodeling of the left ventricle. Normal global wall motion. Doppler evidence of grade II (pseudonormal) diastolic dysfunction, elevated LAP. Calculated EF 55%. Structurally normal pulmonic valve.  Mild pulmonic regurgitation. Compared to 04/29/2020, no significant change.  Labs   Care everywhere/Faxed External Labs:  Labs 12/31/2023:  Total cholesterol 167, triglycerides 141, HDL 52, LDL 91.  Vitamin D  48.  ROS  Review of Systems  Cardiovascular:  Positive for dyspnea on exertion (STABLE). Negative for chest pain and leg swelling.   Physical Exam:   VS:  BP (!) 128/50 (BP Location: Left Arm, Patient Position: Sitting, Cuff Size: Normal)   Pulse 66   Ht 5' 4 (1.626 m)   Wt 167 lb 4.8 oz (75.9 kg)   SpO2 97%   BMI 28.72 kg/m    Wt Readings from Last 3 Encounters:  04/08/24 167 lb 4.8 oz (75.9 kg)  03/15/24 165 lb (74.8 kg)  01/10/24 165 lb (74.8 kg)    BP Readings from Last 3 Encounters:  04/08/24 (!) 128/50  03/15/24 (!) 162/58  01/10/24 125/75   Physical Exam Neck:     Vascular: No JVD.  Cardiovascular:     Rate and Rhythm: Normal rate and regular rhythm.     Pulses: Intact distal pulses.     Heart sounds: S1 normal and S2 normal. Murmur  heard.     Early systolic murmur is present with a grade of 2/6 at the upper right sternal border.     No gallop.  Pulmonary:     Effort: Pulmonary effort is normal.     Breath sounds: Normal breath sounds.  Abdominal:     General: Bowel sounds are normal.     Palpations: Abdomen is soft.  Musculoskeletal:     Right lower leg: No edema.     Left lower leg: No edema.    EKG:    EKG Interpretation Date/Time:  Monday April 08 2024 09:07:12 EST Ventricular Rate:  66 PR Interval:  202 QRS Duration:  76 QT Interval:  416 QTC Calculation: 436 R Axis:   0  Text Interpretation: Normal sinus rhythm Minimal voltage criteria for LVH, may be normal variant ( R in aVL ) When compared with ECG of 14-Jun-2023 14:19, No significant change was found Confirmed by Leviticus Harton, Jagadeesh [phone]) on 04/08/2024 9:32:55 AM    ASSESSMENT AND PLAN: .      ICD-10-CM   1. Hypertensive left ventricular hypertrophy, without heart failure  I11.9 EKG 12-Lead    2. Dyspnea on exertion  R06.09     3. Exercise-induced asthma  J45.990 albuterol  (VENTOLIN  HFA) 108 (90 Base) MCG/ACT inhaler    4. Mild hyperlipidemia  E78.5      Assessment & Plan Hypertensive heart disease with left ventricular hypertrophy Well-controlled with current medication regimen. Blood pressure is 128/50 mmHg. Previous cardiac evaluations, including stress test and cardiac MRI, were normal. Shortness of breath is likely due to age-related deconditioning rather than hypertrophic cardiomyopathy, which has been ruled out. - Continue nebivolol  10 mg once daily. - Continue losartan  25 mg once daily, with the option to skip the dose on travel days to prevent dizziness. - Prescribed compression stockings for use during travel to prevent dizziness.  Exercise-induced asthma Managed with albuterol  inhaler, which is effective when needed, especially during activities like walking her horse. - Prescribed albuterol  inhaler with a  refill.  Hyperlipidemia Well-managed with atorvastatin. Recent cholesterol levels are within target range with LDL at 91 mg/dL and HDL at 52 mg/dL. - Continue atorvastatin 10 mg daily.  Follow up: 1 year for follow-up for dyspnea and hypertensive heart disease and if she remains stable on a as needed basis.  Signed,  Gordy Bergamo, MD, [facility] 04/08/2024, 9:46 AM [facility] [address] [place], [place] [address] Phone: [phone]. Fax:  [phone]  "

## 2024-04-08 ENCOUNTER — Ambulatory Visit: Attending: Cardiology | Admitting: Cardiology

## 2024-04-08 ENCOUNTER — Encounter: Payer: Self-pay | Admitting: Cardiology

## 2024-04-08 VITALS — BP 128/50 | HR 66 | Ht 64.0 in | Wt 167.3 lb

## 2024-04-08 DIAGNOSIS — I119 Hypertensive heart disease without heart failure: Secondary | ICD-10-CM | POA: Diagnosis not present

## 2024-04-08 DIAGNOSIS — J45990 Exercise induced bronchospasm: Secondary | ICD-10-CM | POA: Diagnosis not present

## 2024-04-08 DIAGNOSIS — R0609 Other forms of dyspnea: Secondary | ICD-10-CM

## 2024-04-08 DIAGNOSIS — E785 Hyperlipidemia, unspecified: Secondary | ICD-10-CM | POA: Diagnosis not present

## 2024-04-08 MED ORDER — ALBUTEROL SULFATE HFA 108 (90 BASE) MCG/ACT IN AERS: 2.0000 | INHALATION_SPRAY | Freq: Four times a day (QID) | RESPIRATORY_TRACT | 2 refills | Status: AC | PRN

## 2024-04-08 NOTE — Patient Instructions (Addendum)
 Medication Instructions:  START ALBUTEROL  (VENTOLIN  HFA)  Inhale 2 puffs into the lungs every 6 (six) hours as needed for wheezing or shortness of breath  *If you need a refill on your cardiac medications before your next appointment, please call your pharmacy*  Follow-Up: At [facility], you and your health needs are our priority.  As part of our continuing mission to provide you with exceptional heart care, our providers are all part of one team.  This team includes your primary Cardiologist (physician) and Advanced Practice Providers or APPs (Physician Assistants and Nurse Practitioners) who all work together to provide you with the care you need, when you need it.  Your next appointment:   1 year(s)  Provider:   Gordy Bergamo, MD        We recommend signing up for the patient portal called MyChart.  Patients are able to view lab/test results, encounter notes, upcoming appointments, etc.  Non-urgent messages can be sent to your provider as well, go to [email or web address].

## 2024-04-09 ENCOUNTER — Ambulatory Visit: Admitting: Neurology

## 2024-04-15 ENCOUNTER — Telehealth: Payer: Self-pay | Admitting: Cardiology

## 2024-04-15 NOTE — Telephone Encounter (Signed)
" °  Patient would like to check if she left her Light blue button up sweater. She had a visit with Dr. Ladona last week  "

## 2024-05-16 ENCOUNTER — Ambulatory Visit (HOSPITAL_COMMUNITY): Admitting: Psychiatry
